# Patient Record
Sex: Female | Born: 1965
Health system: Southern US, Community
[De-identification: ages and names within clinical notes are randomized; demographics above are authoritative.]

## PROBLEM LIST (undated history)

## (undated) DIAGNOSIS — F32A Depression, unspecified: Secondary | ICD-10-CM

## (undated) DIAGNOSIS — F329 Major depressive disorder, single episode, unspecified: Secondary | ICD-10-CM

## (undated) DIAGNOSIS — K649 Unspecified hemorrhoids: Secondary | ICD-10-CM

## (undated) DIAGNOSIS — F419 Anxiety disorder, unspecified: Secondary | ICD-10-CM

## (undated) HISTORY — PX: BLADDER SURGERY: SHX569

## (undated) HISTORY — DX: Unspecified hemorrhoids: K64.9

## (undated) HISTORY — PX: SPINAL FUSION: SHX223

## (undated) HISTORY — DX: Anxiety disorder, unspecified: F41.9

---

## 1998-08-30 ENCOUNTER — Other Ambulatory Visit: Admission: RE | Admit: 1998-08-30 | Discharge: 1998-08-30 | Payer: Self-pay | Admitting: Obstetrics and Gynecology

## 1999-01-02 ENCOUNTER — Ambulatory Visit (HOSPITAL_COMMUNITY): Admission: RE | Admit: 1999-01-02 | Discharge: 1999-01-02 | Payer: Self-pay | Admitting: Family Medicine

## 1999-01-02 ENCOUNTER — Encounter: Payer: Self-pay | Admitting: Family Medicine

## 1999-04-16 ENCOUNTER — Emergency Department (HOSPITAL_COMMUNITY): Admission: EM | Admit: 1999-04-16 | Discharge: 1999-04-16 | Payer: Self-pay | Admitting: Emergency Medicine

## 1999-08-31 ENCOUNTER — Other Ambulatory Visit: Admission: RE | Admit: 1999-08-31 | Discharge: 1999-08-31 | Payer: Self-pay | Admitting: Obstetrics and Gynecology

## 2000-09-19 ENCOUNTER — Other Ambulatory Visit: Admission: RE | Admit: 2000-09-19 | Discharge: 2000-09-19 | Payer: Self-pay | Admitting: Obstetrics and Gynecology

## 2000-10-25 ENCOUNTER — Encounter: Payer: Self-pay | Admitting: Emergency Medicine

## 2000-10-25 ENCOUNTER — Emergency Department (HOSPITAL_COMMUNITY): Admission: EM | Admit: 2000-10-25 | Discharge: 2000-10-25 | Payer: Self-pay | Admitting: Emergency Medicine

## 2000-10-25 ENCOUNTER — Encounter: Payer: Self-pay | Admitting: Family Medicine

## 2000-11-01 ENCOUNTER — Encounter: Payer: Self-pay | Admitting: Neurosurgery

## 2000-11-01 ENCOUNTER — Observation Stay (HOSPITAL_COMMUNITY): Admission: RE | Admit: 2000-11-01 | Discharge: 2000-11-02 | Payer: Self-pay | Admitting: Neurosurgery

## 2000-12-02 ENCOUNTER — Encounter: Payer: Self-pay | Admitting: Neurosurgery

## 2000-12-02 ENCOUNTER — Encounter: Admission: RE | Admit: 2000-12-02 | Discharge: 2000-12-02 | Payer: Self-pay | Admitting: Neurosurgery

## 2001-01-14 ENCOUNTER — Encounter: Payer: Self-pay | Admitting: Neurosurgery

## 2001-01-14 ENCOUNTER — Encounter: Admission: RE | Admit: 2001-01-14 | Discharge: 2001-01-14 | Payer: Self-pay | Admitting: Neurosurgery

## 2001-03-25 ENCOUNTER — Encounter: Payer: Self-pay | Admitting: Neurosurgery

## 2001-03-25 ENCOUNTER — Encounter: Admission: RE | Admit: 2001-03-25 | Discharge: 2001-03-25 | Payer: Self-pay | Admitting: Neurosurgery

## 2001-10-15 ENCOUNTER — Other Ambulatory Visit: Admission: RE | Admit: 2001-10-15 | Discharge: 2001-10-15 | Payer: Self-pay | Admitting: Obstetrics and Gynecology

## 2002-03-23 ENCOUNTER — Emergency Department (HOSPITAL_COMMUNITY): Admission: EM | Admit: 2002-03-23 | Discharge: 2002-03-23 | Payer: Self-pay | Admitting: Emergency Medicine

## 2002-11-20 ENCOUNTER — Other Ambulatory Visit: Admission: RE | Admit: 2002-11-20 | Discharge: 2002-11-20 | Payer: Self-pay | Admitting: Obstetrics and Gynecology

## 2003-10-03 ENCOUNTER — Inpatient Hospital Stay (HOSPITAL_COMMUNITY): Admission: AD | Admit: 2003-10-03 | Discharge: 2003-10-05 | Payer: Self-pay | Admitting: Obstetrics and Gynecology

## 2003-10-29 ENCOUNTER — Other Ambulatory Visit: Admission: RE | Admit: 2003-10-29 | Discharge: 2003-10-29 | Payer: Self-pay | Admitting: Obstetrics and Gynecology

## 2005-01-02 ENCOUNTER — Other Ambulatory Visit: Admission: RE | Admit: 2005-01-02 | Discharge: 2005-01-02 | Payer: Self-pay | Admitting: Obstetrics and Gynecology

## 2005-02-09 ENCOUNTER — Ambulatory Visit: Payer: Self-pay | Admitting: Cardiology

## 2005-02-19 ENCOUNTER — Encounter: Admission: RE | Admit: 2005-02-19 | Discharge: 2005-05-20 | Payer: Self-pay | Admitting: Cardiology

## 2005-05-24 ENCOUNTER — Ambulatory Visit: Payer: Self-pay | Admitting: Gastroenterology

## 2006-01-25 ENCOUNTER — Other Ambulatory Visit: Admission: RE | Admit: 2006-01-25 | Discharge: 2006-01-25 | Payer: Self-pay | Admitting: Obstetrics and Gynecology

## 2008-09-03 ENCOUNTER — Ambulatory Visit: Payer: Self-pay | Admitting: Gastroenterology

## 2008-09-17 ENCOUNTER — Ambulatory Visit: Payer: Self-pay | Admitting: Gastroenterology

## 2009-09-26 ENCOUNTER — Encounter: Admission: RE | Admit: 2009-09-26 | Discharge: 2009-09-26 | Payer: Self-pay | Admitting: Obstetrics and Gynecology

## 2011-05-04 NOTE — Op Note (Signed)
[place]. [facility]  Patient:    Bethany Hernandez, Bethany Hernandez                      MRN: [id number] Proc. Date: 11/01/00 Adm. Date:  [id number] Disc. Date: [id number] Attending:  Coletta Memos                           Operative Report  PREOPERATIVE DIAGNOSIS:  C5-6 spinal instability secondary to ligamentous tear.  POSTOPERATIVE DIAGNOSIS:  C5-6 spinal instability secondary to ligamentous tear.  PROCEDURE:  Posterior spinal fusion C5-6 with lateral mass screws and rod and a spinous wiring with ______ cable and allograft.  COMPLICATIONS:  None.  SURGEON:  Kyle L. Franky Macho, M.D.  ASSISTANT:  Tanya Nones. Jeral Fruit, M.D.  INDICATIONS:  Bethany Hernandez is a 45 year old woman who presented to the emergency room with a C5-6 instability and unstable facets.  She had a ligamentous tear posteriorly.  She was therefore brought to the operating room for spinal fusion.  PROCEDURE IN DETAIL:  Bethany Hernandez was brought to the operating room, intubated and placed under general anesthesia without difficulty.  She was placed in a 3 pin fixation at approximately 70 pounds with a [place] system and then turned prone onto shoulder rolls with her Miami J collar in place to minimize any abnormal movements of the neck.  The collar was removed, and I took an x-ray and it showed her neck to be in good position in that she was reduced.  I then shaved the back of her head slightly and then prepped in sterile fashion.  She was draped in a sterile fashion.  I infiltrated her proposed incision with 0.50% lidocaine, 1:200,000 epinephrine using approximately 15 cc.  I made my skin incision with a #10 blade and took this down in the midline ______.  I then took this down to the initial spinous process that I was able to palpate with monopolar cautery.  Taking an x-ray showed that I was probably at C4.  I then extended my excision caudally and again exposed lamina until I had the spinous process of C7,  C5, and C4 out.  The spinous process of C6 was actually quite small and was somewhat tucked underneath that of C5, but through a number of intraoperative x-rays I was able to securely state that I was at C5-6.  I then exposed the lateral masses of C5-6 bilaterally.  I placed a ______ cable around the spinous processes of C5-6.  I then placed lateral mass ______  12 mm 3.5 pitch into C5 and C6 bilaterally, first on the right and then on the left side.  I took another x-ray and placed a rod at that point in time into the lateral mass grooves and secured that.  I took another x-ray and it showed the screws and the cable to be in good position and that she was in reduction.  I then irrigated the wound.  The facets were packed with synthetic fusion bone which had been mixed with the patients blood.  The facets were also denuded bilaterally.  The patient had her wound irrigated.  I then closed the wound in layer fashion, reapproximated the muscular tissue and then the fascia.  Then the subcutaneous tissues all with Vicryl sutures. The superficial skin was reapproximated with Steri-Strips.  Sterile dressing was applied.  The patient was awakened, moving all extremities well. DD:  11/01/00 TD:  11/02/00 Job:  [id number]

## 2011-05-04 NOTE — H&P (Signed)
[place]. [facility]  Patient:    Bethany Hernandez, Bethany Hernandez                      MRN: [id number] Adm. Date:  [id number] Attending:  Coletta Memos                         History and Physical  ADMITTING DIAGNOSIS: Neck pain and C5-6 instability.  HISTORY OF PRESENT ILLNESS: The patient is a 45 year old woman who fell off her daughters bed while roughhousing the morning of October 25, 2000 at 6 a.m.  She landed on her neck posteriorly and felt a pop at that time.  She has had significant pain between the shoulder blades the day of admission.  She also experienced some numbness and tingling in both shoulders while brushing her teeth that morning.  Due to the persistent pain she saw her local physician and she was then sent to the [facility] Room. Plain x-rays there showed that she had a kyphotic deformity at C5-6 due to a posterior ligamentous tear.  I then saw her at [facility] for consultation after obtaining a CT of the cervical spine.  She persisted with some neck pain and pain between the shoulder blades.  She had no bowel or bladder dysfunction.  She had no weakness.  She had no other problems whatsoever.  ALLERGIES: No known drug allergies.  PAST MEDICAL HISTORY: Her past medical history is excellent.  PAST SURGICAL HISTORY: She had bladder surgery as a child.  REVIEW OF SYSTEMS: The Review Of Systems is positive only for neck pain.  She denies constitutional, eye, ear, nose, throat, mouth, cardiovascular, respiratory, gastrointestinal, genitourinary, skin, neurologic, psychiatric, endocrine, hematologic, or allergic problems.  FAMILY HISTORY: Positive for cancer and heart disease.  Both parents are deceased.  MEDICATIONS: Vicodin on occasion for pain.  SOCIAL HISTORY: She does not use tobacco, alcohol, or illicit drugs.  PHYSICAL EXAMINATION:  GENERAL: She is alert and oriented x 4.  Speech is clear and  fluent.  VITAL SIGNS: Pulse 80.  HEENT: PERRL.  EOMI.  NEUROLOGIC: Cranial nerves 2-12 examination normal.  Memory, language, and attention span are normal.  Strength 5/5 in upper and lower extremities. Normal muscle tone, bulk, and coordination.  Reflexes 2+ at the biceps and triceps, brachial radialis, knees, and ankles.  NECK: No cervical masses or bruits.  CHEST: Lung fields clear.  HEART: Regular rate and rhythm.  EXTREMITIES: Pulses good at wrists and feet bilaterally.  Normal gait. Negative Romberg test.  ASSESSMENT/PLAN: I explained to the patient that we need to do a posterior cervical fusion secondary to ligamentous instability at C5-6.  She has been wearing a [place] J collar for the past week without difficulty and has had no neck pain or any return of the numbness and tingling she had initially the day of her injury.  The risks of the procedure including bleeding, infection, no pain relief, paralysis, upper extremity weakness, fusion failure, hardware failure were discussed.  She understands and wishes to proceed. DD:  11/01/00 TD:  11/02/00 Job: [id number]

## 2011-05-04 NOTE — Consult Note (Signed)
[place]. [facility]  Patient:    Bethany Hernandez, Bethany Hernandez                      MRN: [id number] Proc. Date: 10/25/00 Adm. Date:  [id number] Attending:  Osvaldo Human                          Consultation Report  CHIEF COMPLAINT:  Neck pain and questionable perched facet C5-6.  HISTORY OF PRESENT ILLNESS:  Ms. Brassfield is a 45 year old woman who fell off her daughters bed while roughhousing this morning approximately 6 a.m. She landed on her neck posteriorly and felt a pop at that time. She had significant pain between the shoulder blades. She also experienced some numbness and tingling in both shoulders when she was brushing her teeth this morning. Due to the persistent pain, she made an appointment to see a local physician; and she saw him, I think, approximately 3:30 this afternoon. She was then instructed to go to the [facility] Emergency Room where a cervical spine series revealed a malalignment at C5-6. She had splaying of the spinous processes at that level and appeared to have a ligamentous injury. She was then transferred to [facility] to receive CT and neurosurgical evaluation. Throughout the day, she has had a normal neurologic examination with the exception of the tingling in the shoulders which she had only this morning. She has had no bowel or bladder dysfunction. She has had no previous neck injuries. She has pain now at the junction of the neck and the shoulder blades posteriorly.  ALLERGIES:  No known drug allergies.  PAST MEDICAL HISTORY:  Excellent. She had bladder surgery as a child.  REVIEW OF SYSTEMS:  Positive only for neck pain. She denies constitutional eye, ear, nose, throat, mouth, cardiovascular, respiratory, gastrointestinal, genitourinary, skin, neurological, psychiatric, endocrine, hematologic, or allergic problems.  FAMILY HISTORY:  Positive for cancer and heart disease. Parents are both deceased.  CURRENT MEDICATIONS:   She takes no medications.  SOCIAL HISTORY:  She does use tobacco, alcohol, or illicit drugs.  PHYSICAL EXAMINATION:  GENERAL:  She alert and oriented x 4. Speech is clear and fluent.  VITAL SIGNS:  She is 5 feet 7 inches tall, weighing 160 pounds, blood pressure 115/75, pulse 80, respiratory rate 22, temperature 97.3.  HEENT:  Pupils are equal, round, and reactive to light. She has full extraocular movements.  NEUROLOGICAL:  Cranial nerves 2 through 12 are normal. Memory, language, and attention span are normal. She has 5/5 strength in the upper and lower extremities. Normal muscle tone, bulk, and coordination. She has intact proprioception to light touch. She has 2+ reflexes in the upper and lower extremities at the biceps, triceps, brachioradialis, knees, and ankles. Some pain with abduction of the right arm. She had no cervical masses or bruits. No discoloration to the cervical region. Some tenderness posteriorly approximately at C7 spinous process.  HEART:  Regular rate and rhythm. No murmurs or rubs.  EXTREMITIES:  Pulses good at the wrists and the feet bilaterally.  LUNGS:  Clear.  ABDOMEN:  Soft, nontender.  RADIOLOGIC STUDIES:  CT does not show evidence of bone fractures.  Plain films show malalignment at C5-6 with the C5 inferior facet riding high on the C6 facet although they are not jumped nor are they perched.  IMPRESSION:  C5-6 posterior spinous ligament rupture with subsequent malalignment at C5-6.  PLAN:  Secondary to the  fact that this is a ligamentous injury, it is unlikely that Mrs. Vetsch will be able to heal this with conservative treatment. I, therefore, plan on doing an elective posterior spinal fusion sometime next week. I would like to bring her back to my office so we can go over things more in detail. She is somewhat stunned tonight due to all of the various things that she has been hit with for what she thought was just going to be a minor  injury due to some roughhousing she and her daughter were doing this morning. She has no neurologic deficit; and therefore, I do not see any emergency to this. She is certainly stable in her collar, and I gave her instructions to keep the collar on at all times. I will see her back in the office and make arrangements for that. I will also give her a prescription for Tylenol No. 3 for pain control. DD:  10/25/00 TD:  10/26/00 Job: [id number]

## 2011-05-04 NOTE — Discharge Summary (Signed)
   NAME:  Bethany Hernandez, Bethany Hernandez                         ACCOUNT NO.:  [id number]   MEDICAL RECORD NO.:  [id number]                   PATIENT TYPE:  INP   LOCATION:  9120                                 FACILITY:  [facility]   PHYSICIAN:  Duke Salvia. Marcelle Overlie, M.D.            DATE OF BIRTH:  09-30-1966   DATE OF ADMISSION:  10/03/2003  DATE OF DISCHARGE:                                 DISCHARGE SUMMARY   HISTORY:  This patient was complete, complete, +2, straight OA.  Her Foley  catheter had just been removed.  She had a pretty solid epidural with poor  pushing quality.  When she did push when coached she had some variable  decelerations with somewhat slow recovery.  After talking with the patient  and her husband, decided to proceed with VE assisted delivery.  Facemask O2  for the mom was already running.  The Kiwi VE was applied.  Traction effort  coordinated with the next maternal pushing effort x1 only to affect delivery  of a healthy female, Apgars 9 and 9.  pH is pending.  This was done over a  midline episiotomy.  The infant was suctioned, placed on the mother's  abdomen in excellent condition.  Placenta delivered spontaneously intact.  The cervix and upper vagina were inspected and were unremarkable.  Surgeon's  finger was placed in the rectum to perform examination and this revealed  that the sphincter muscle and rectum were intact.  She stated to me at that  point that she had requested that her sphincter muscle be tightened, if  possible.  Several interrupted 2-0 Vicryl sutures were placed through the  sphincter muscle and fascia in an effort to try to reinforce this in the  midline.  Layer repair with 3-0 Vicryl Rapide closing the mucosa, 2-0 Vicryl  interrupted sutures on the deeper perineal tissue, and 3-0 Vicryl Rapide on  the perineal skin were used to close the episiotomy in standard fashion.  She tolerated this well.  Mother and baby doing well at that point.                           Richard M. Marcelle Overlie, M.D.    [facility]  D:  10/03/2003  T:  10/04/2003  Job:  [id number]

## 2012-07-02 ENCOUNTER — Other Ambulatory Visit (INDEPENDENT_AMBULATORY_CARE_PROVIDER_SITE_OTHER): Payer: Federal, State, Local not specified - PPO

## 2012-07-02 ENCOUNTER — Encounter: Payer: Self-pay | Admitting: Gastroenterology

## 2012-07-02 ENCOUNTER — Ambulatory Visit (INDEPENDENT_AMBULATORY_CARE_PROVIDER_SITE_OTHER): Payer: Federal, State, Local not specified - PPO | Admitting: Gastroenterology

## 2012-07-02 VITALS — BP 100/70 | HR 84 | Ht 67.0 in | Wt 236.0 lb

## 2012-07-02 DIAGNOSIS — R1011 Right upper quadrant pain: Secondary | ICD-10-CM

## 2012-07-02 DIAGNOSIS — Z800 Family history of malignant neoplasm of digestive organs: Secondary | ICD-10-CM

## 2012-07-02 LAB — CBC WITH DIFFERENTIAL/PLATELET
Basophils Absolute: 0.1 10*3/uL (ref 0.0–0.1)
HCT: 42.5 % (ref 36.0–46.0)
Lymphs Abs: 2.8 10*3/uL (ref 0.7–4.0)
Monocytes Relative: 7.2 % (ref 3.0–12.0)
Neutrophils Relative %: 55 % (ref 43.0–77.0)
Platelets: 209 10*3/uL (ref 150.0–400.0)
RDW: 13.3 % (ref 11.5–14.6)
WBC: 7.9 10*3/uL (ref 4.5–10.5)

## 2012-07-02 LAB — COMPREHENSIVE METABOLIC PANEL
ALT: 20 U/L (ref 0–35)
Albumin: 4 g/dL (ref 3.5–5.2)
CO2: 23 mEq/L (ref 19–32)
Calcium: 9.8 mg/dL (ref 8.4–10.5)
Chloride: 103 mEq/L (ref 96–112)
GFR: 54.32 mL/min — ABNORMAL LOW (ref >60.00)
Glucose, Bld: 91 mg/dL (ref 70–99)
Potassium: 4.2 mEq/L (ref 3.5–5.1)
Sodium: 138 mEq/L (ref 135–145)
Total Bilirubin: 0.2 mg/dL — ABNORMAL LOW (ref 0.3–1.2)
Total Protein: 7.6 g/dL (ref 6.0–8.3)

## 2012-07-02 LAB — LIPASE: Lipase: 26 U/L (ref 11.0–59.0)

## 2012-07-02 NOTE — Patient Instructions (Addendum)
Your physician has requested that you go to the basement for the following lab work before leaving today:CBC, Cmet, Lipase.  You have been scheduled for an abdominal ultrasound at [facility] ([address] on 07/07/12 at 8:00am. Please arrive 15 minutes prior to your appointment for registration. Make certain not to have anything to eat or drink 6 hours prior to your appointment. Should you need to reschedule your appointment, please contact [facility] at [phone].

## 2012-07-02 NOTE — Progress Notes (Signed)
History of Present Illness: This is a 46 year old female who relates 2 episodes of severe right upper quadrant pain/right lower chest pain radiating to her back that lasted for approximately 3 hours each. The first episode was associated with nausea and vomiting the second episode with nausea. She relates frequent belching and is concerned that this may be related to milk.  Denies weight loss, constipation, diarrhea, change in stool caliber, melena, hematochezia, nausea, vomiting, dysphagia, reflux symptoms, chest pain.  Review of Systems: Pertinent positive and negative review of systems were noted in the above HPI section. All other review of systems were otherwise negative.  Current Medications, Allergies, Past Medical History, Past Surgical History, Family History and Social History were reviewed in [employer] record.  Physical Exam: General: Well developed , well nourished, no acute distress Head: Normocephalic and atraumatic Eyes:  sclerae anicteric, EOMI Ears: Normal auditory acuity Mouth: No deformity or lesions Neck: Supple, no masses or thyromegaly Lungs: Clear throughout to auscultation Heart: Regular rate and rhythm; no murmurs, rubs or bruits Abdomen: Soft, non tender and non distended. No masses, hepatosplenomegaly or hernias noted. Normal Bowel sounds Musculoskeletal: Symmetrical with no gross deformities  Skin: No lesions on visible extremities Pulses:  Normal pulses noted Extremities: No clubbing, cyanosis, edema or deformities noted Neurological: Alert oriented x 4, grossly nonfocal Cervical Nodes:  No significant cervical adenopathy Inguinal Nodes: No significant inguinal adenopathy Psychological:  Alert and cooperative. Normal mood and affect  Assessment and Recommendations:  1. Right upper quadrant pain associated with nausea and vomiting. Rule out cholelithiasis. Schedule abdominal ultrasound and obtain blood work today. If no diagnosis is  uncovered begin antireflux measures, Prilosec OTC on a daily basis and schedule upper endoscopy.  2. Family history of rectal cancer in her mother at age 20. She is due for followup colonoscopy October 2014.

## 2012-07-07 ENCOUNTER — Other Ambulatory Visit: Payer: Federal, State, Local not specified - PPO

## 2012-07-14 ENCOUNTER — Ambulatory Visit
Admission: RE | Admit: 2012-07-14 | Discharge: 2012-07-14 | Disposition: A | Discharge status: Home / Self Care | Payer: Federal, State, Local not specified - PPO | Source: Ambulatory Visit | Attending: Gastroenterology | Admitting: Gastroenterology

## 2012-07-14 ENCOUNTER — Other Ambulatory Visit: Payer: Self-pay

## 2012-07-14 DIAGNOSIS — R1011 Right upper quadrant pain: Secondary | ICD-10-CM

## 2012-07-14 DIAGNOSIS — K8020 Calculus of gallbladder without cholecystitis without obstruction: Secondary | ICD-10-CM

## 2012-07-14 NOTE — Progress Notes (Signed)
I have left a message with [place] at [facility].  I am waiting on a surgical appt.  Patient is scheduled with Dr. Magnus Ivan for 07/24/12 2:30 arrival for 2;50 appt.  I have left a message for the patient to call back

## 2012-07-15 NOTE — Progress Notes (Signed)
Patient advised of the appt date and time.  She will call back for any additional questions or concerns

## 2012-07-24 ENCOUNTER — Ambulatory Visit (INDEPENDENT_AMBULATORY_CARE_PROVIDER_SITE_OTHER): Payer: Self-pay | Admitting: Surgery

## 2012-08-06 ENCOUNTER — Ambulatory Visit (INDEPENDENT_AMBULATORY_CARE_PROVIDER_SITE_OTHER): Payer: Self-pay | Admitting: Surgery

## 2012-08-25 ENCOUNTER — Encounter (INDEPENDENT_AMBULATORY_CARE_PROVIDER_SITE_OTHER): Payer: Self-pay | Admitting: Surgery

## 2012-08-25 ENCOUNTER — Ambulatory Visit (INDEPENDENT_AMBULATORY_CARE_PROVIDER_SITE_OTHER): Payer: Federal, State, Local not specified - PPO | Admitting: Surgery

## 2012-08-25 VITALS — BP 132/70 | HR 68 | Temp 97.3°F | Resp 14 | Ht 66.0 in | Wt 234.0 lb

## 2012-08-25 DIAGNOSIS — K8020 Calculus of gallbladder without cholecystitis without obstruction: Secondary | ICD-10-CM

## 2012-08-25 NOTE — Progress Notes (Signed)
Patient ID: Bethany Hernandez, female   DOB: [birth year], 46 y.o.   MRN: [id number]  Chief Complaint  Patient presents with  . Other    gallstones    HPI BRONTE Hernandez is a 46 y.o. female.   HPIthis is a very pleasant female referred by Dr. Russella Dar after an attack of symptomatic cholelithiasis. This occurred in July. She had right upper quadrant abdominal pain as well as nausea and vomiting after a fatty meal. The pain was severe but did not radiate anywhere else. Since then, she has been on a low-fat diet and has been completely pain free. She has no complaints today.  Past Medical History  Diagnosis Date  . Hemorrhoids   . Anxiety     Past Surgical History  Procedure Date  . Spinal fusion   . Bladder surgery     Family History  Problem Relation Age of Onset  . Rectal cancer Mother     Social History History  Substance Use Topics  . Smoking status: Never Smoker   . Smokeless tobacco: Never Used  . Alcohol Use: No    Allergies  Allergen Reactions  . Sulfonamide Derivatives Hives    All over the body    Current Outpatient Prescriptions  Medication Sig Dispense Refill  . buPROPion (WELLBUTRIN XL) 150 MG 24 hr tablet Take 150 mg by mouth daily.      . fluvoxaMINE (LUVOX) 100 MG tablet Take 100 mg by mouth at bedtime.        Review of Systems Review of Systems  Constitutional: Negative for fever, chills and unexpected weight change.  HENT: Negative for hearing loss, congestion, sore throat, trouble swallowing and voice change.   Eyes: Negative for visual disturbance.  Respiratory: Negative for cough and wheezing.   Cardiovascular: Negative for chest pain, palpitations and leg swelling.  Gastrointestinal: Negative for nausea, vomiting, abdominal pain, diarrhea, constipation, blood in stool, abdominal distention and anal bleeding.  Genitourinary: Negative for hematuria, vaginal bleeding and difficulty urinating.  Musculoskeletal: Negative for arthralgias.  Skin:  Negative for rash and wound.  Neurological: Negative for seizures, syncope and headaches.  Hematological: Negative for adenopathy. Does not bruise/bleed easily.  Psychiatric/Behavioral: Negative for confusion.    Blood pressure 132/70, pulse 68, temperature 97.3 F (36.3 C), temperature source Temporal, resp. rate 14, height 5\' 6"  (1.676 m), weight 234 lb (106.142 kg).  Physical Exam Physical Exam  Constitutional: She is oriented to person, place, and time. She appears well-developed and well-nourished. No distress.  HENT:  Head: Normocephalic and atraumatic.  Right Ear: External ear normal.  Left Ear: External ear normal.  Nose: Nose normal.  Mouth/Throat: Oropharynx is clear and moist. No oropharyngeal exudate.  Eyes: Conjunctivae are normal. Pupils are equal, round, and reactive to light. Right eye exhibits no discharge. Left eye exhibits no discharge. No scleral icterus.  Neck: Normal range of motion. Neck supple. No tracheal deviation present. No thyromegaly present.  Cardiovascular: Normal rate, regular rhythm, normal heart sounds and intact distal pulses.   No murmur heard. Pulmonary/Chest: Effort normal and breath sounds normal. No respiratory distress. She has no wheezes. She has no rales.  Abdominal: Soft. Bowel sounds are normal. She exhibits no distension. There is no tenderness. There is no rebound.  Musculoskeletal: Normal range of motion. She exhibits no edema and no tenderness.  Lymphadenopathy:    She has no cervical adenopathy.  Neurological: She is alert and oriented to person, place, and time.  Skin: Skin is warm and  dry. No rash noted. She is not diaphoretic. No erythema.  Psychiatric: Her behavior is normal. Judgment normal.    Data Reviewed I have her laboratory data showed normal liver function tests. Her white blood count is normal.  Ultrasound showed a 2.9 cm gallstone impacted in the gallbladder neck. Bile duct is normal  Assessment    Symptomatic  cholelithiasis    Plan   laparoscopic cholecystectomy with possible cholangiogram is recommended. I discussed the procedure in detail.  The patient was given Agricultural engineer.  We discussed the risks and benefits of a laparoscopic cholecystectomy and possible cholangiogram including, but not limited to bleeding, infection, injury to surrounding structures such as the intestine or liver, bile leak, retained gallstones, need to convert to an open procedure, prolonged diarrhea, blood clots such as  DVT, common bile duct injury, anesthesia risks, and possible need for additional procedures.  The likelihood of improvement in symptoms and return to the patient's normal status is good. We discussed the typical post-operative recovery course.likelihood of success is good.       Kamilya Wakeman A 08/25/2012, 9:23 AM

## 2012-09-10 ENCOUNTER — Telehealth (INDEPENDENT_AMBULATORY_CARE_PROVIDER_SITE_OTHER): Payer: Self-pay

## 2012-09-10 NOTE — Telephone Encounter (Signed)
You don't pee out gallstones.  She doesn't need another ultrasound

## 2012-09-10 NOTE — Telephone Encounter (Signed)
Patient scheduled for gallbladder surgery in November, patient states she passed a small gray pebble like object when voiding.  Patient states this happened end of June and wasn't sure if she needed to repeat her [place] before surgery to make sure it wasn't a gallstone she passed.

## 2012-10-17 ENCOUNTER — Other Ambulatory Visit (HOSPITAL_COMMUNITY): Payer: Federal, State, Local not specified - PPO

## 2012-10-24 ENCOUNTER — Ambulatory Visit: Admit: 2012-10-24 | Payer: Self-pay | Admitting: Surgery

## 2012-10-24 SURGERY — LAPAROSCOPIC CHOLECYSTECTOMY
Anesthesia: General

## 2012-11-11 ENCOUNTER — Encounter (INDEPENDENT_AMBULATORY_CARE_PROVIDER_SITE_OTHER): Payer: Federal, State, Local not specified - PPO | Admitting: Surgery

## 2013-03-15 IMAGING — US US ABDOMEN COMPLETE
1 series · 14 of 25 positions shown · non-contrast
Comparison: None.

CLINICAL DATA: Right upper abdominal pain

COMPLETE ABDOMINAL ULTRASOUND

[Series 1: us abdomen complete · 0.26mm/px · 14 of 81 slices shown]
[im 1/81]
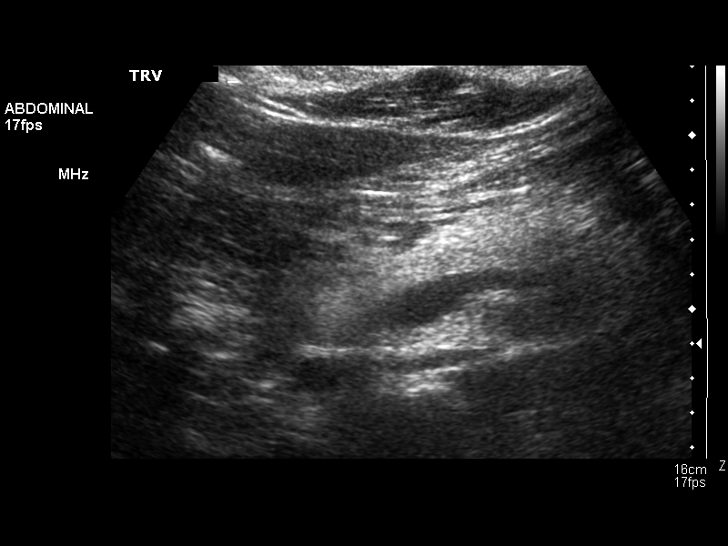
[im 7/81]
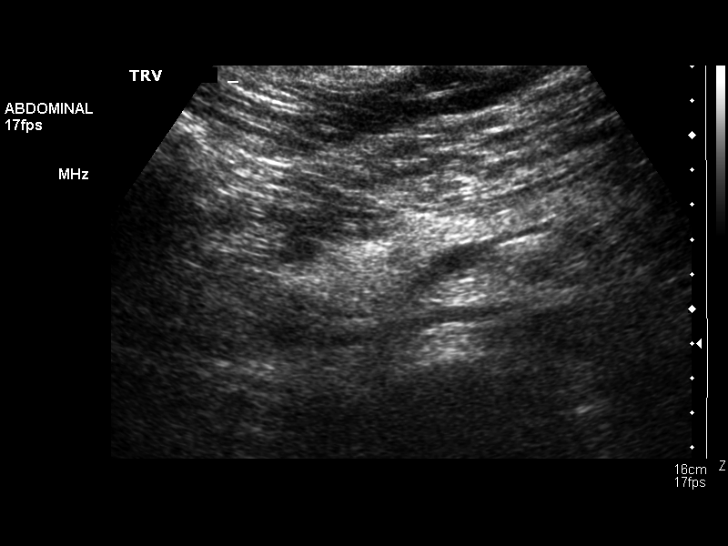
[im 14/81]
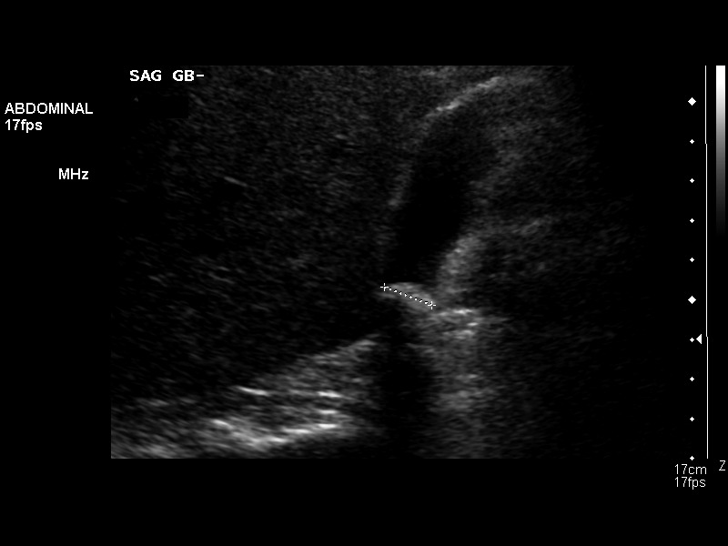
[im 21/81]
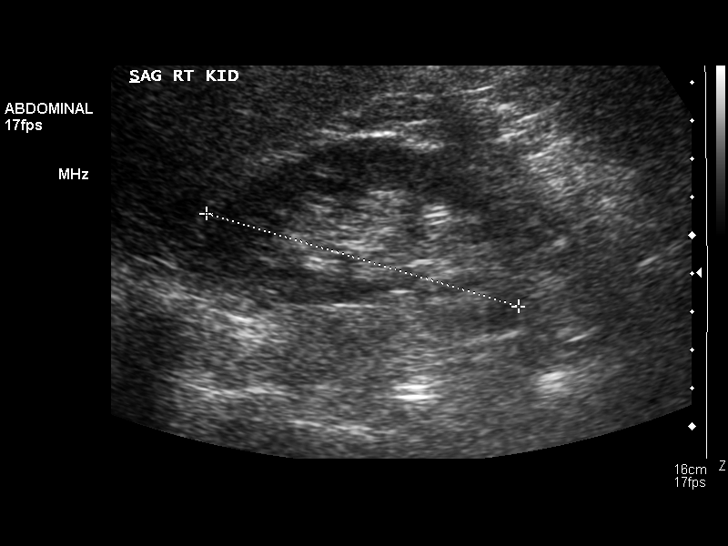
[im 27/81]
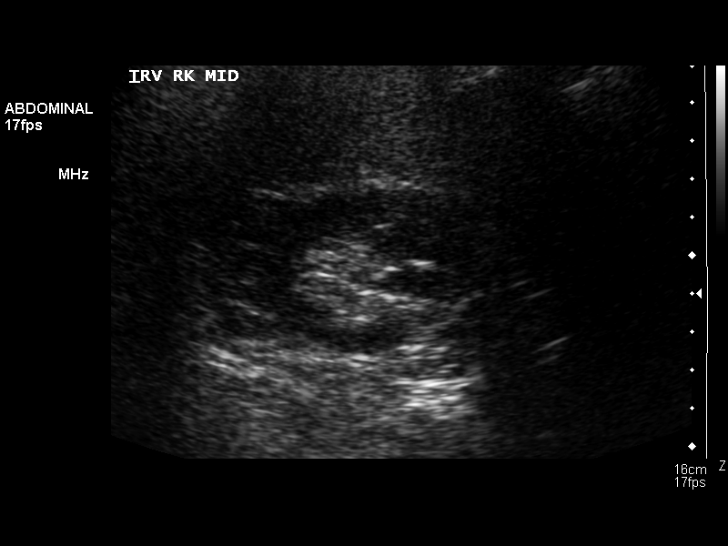
[im 31/81]
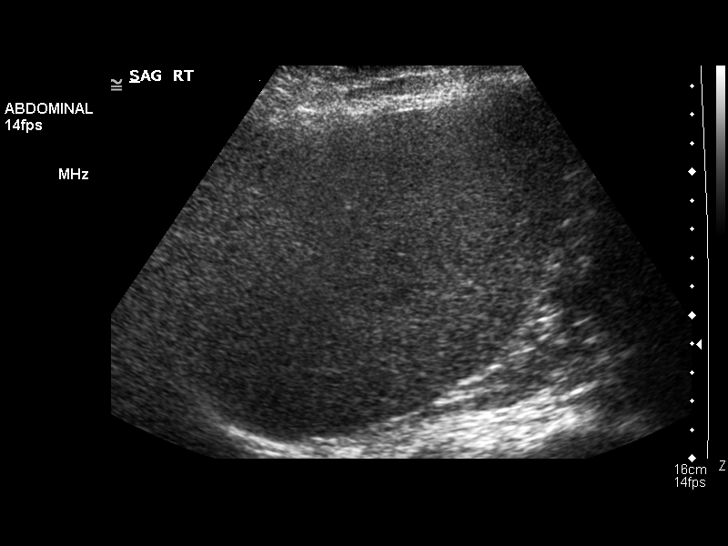
[im 37/81]
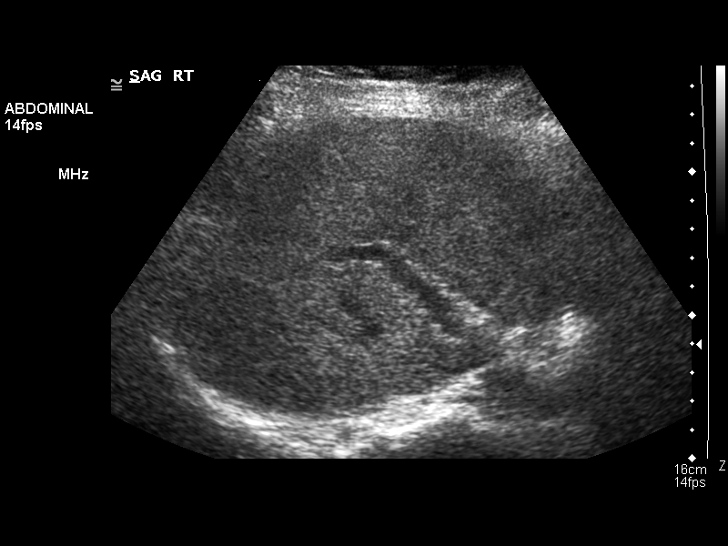
[im 44/81]
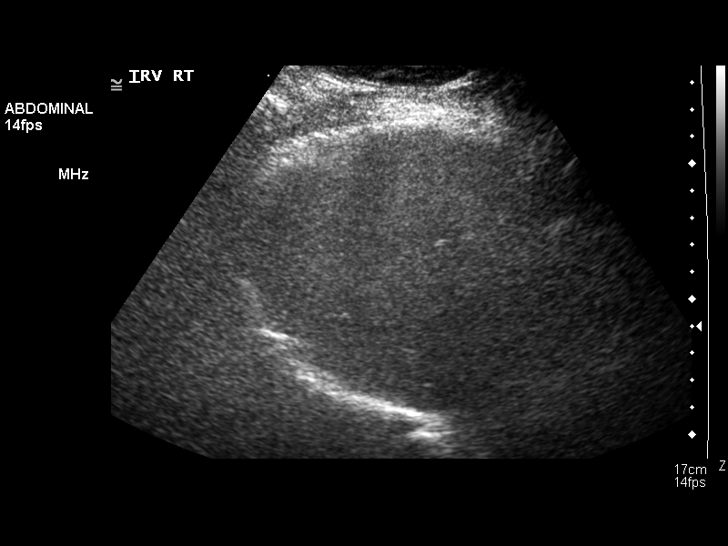
[im 51/81]
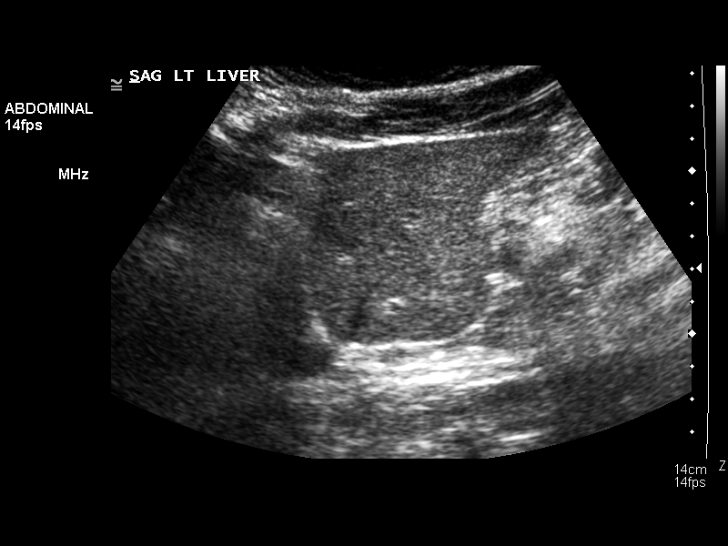
[im 54/81]
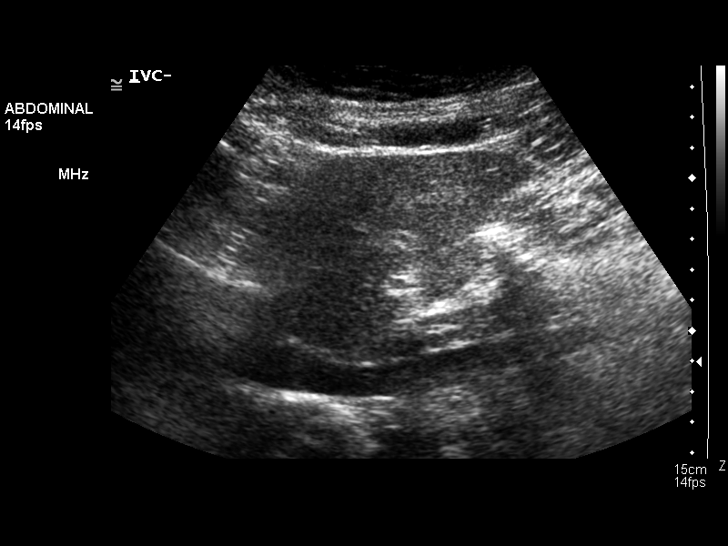
[im 61/81]
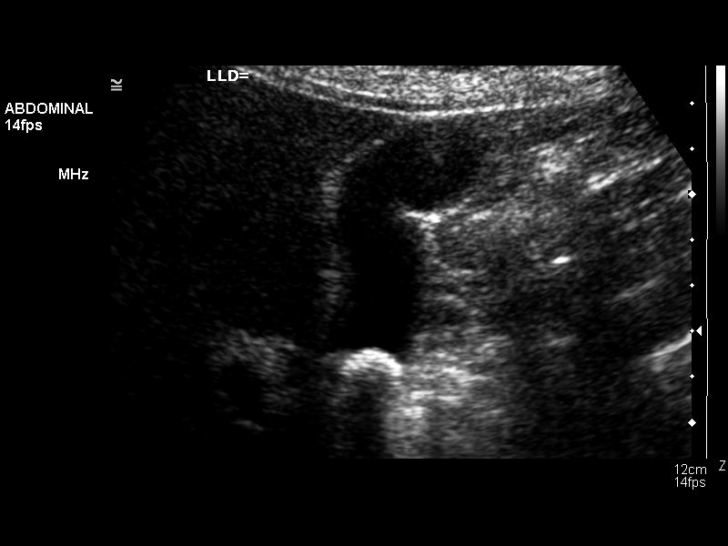
[im 67/81]
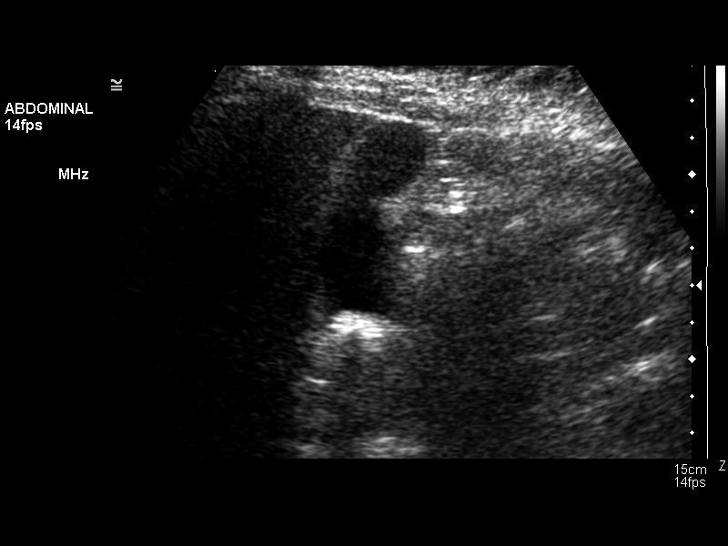
[im 74/81]
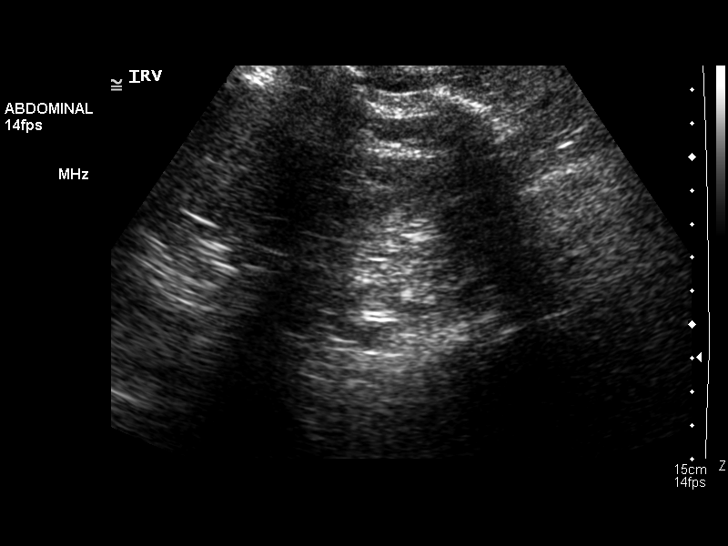
[im 81/81]
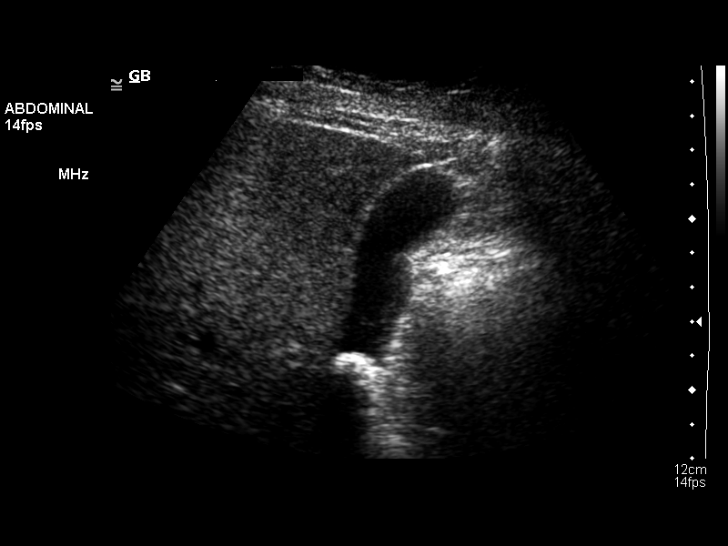

[14 of 25 positions shown; findings below may reference images not displayed]

FINDINGS: Gallbladder:  Physiologically distended.  There is a 13 mm stone
impacted the gallbladder neck.  There is no gallbladder wall
thickness or pericholecystic fluid.  Negative sonographic Murphy's
sign per technologist.

Common bile duct:  2.9 mm diameter, unremarkable.

Liver:  Normal size and echotexture without focal parenchymal
abnormality.  Patent portal vein with hepatopetal flow.

IVC:  Appears normal.

Pancreas:  No focal abnormality seen.

Spleen:  9.1 cm craniocaudal length, unremarkable.

Right Kidney:  10.5 cm. No hydronephrosis.  Well-preserved cortex.
Normal size and parenchymal echotexture without focal
abnormalities.

Left Kidney:  10.8 cm. No hydronephrosis.  Well-preserved cortex.
Normal size and parenchymal echotexture without focal
abnormalities.

Abdominal aorta:  No aneurysm identified.
IMPRESSION: 1.  Impacted stone in the gallbladder neck without other ultrasound
evidence of cholecystitis or biliary obstruction.

## 2013-07-30 ENCOUNTER — Encounter: Payer: Self-pay | Admitting: Gastroenterology

## 2013-10-21 ENCOUNTER — Other Ambulatory Visit: Payer: Self-pay | Admitting: Obstetrics and Gynecology

## 2013-10-21 DIAGNOSIS — R928 Other abnormal and inconclusive findings on diagnostic imaging of breast: Secondary | ICD-10-CM

## 2013-11-10 ENCOUNTER — Ambulatory Visit
Admission: RE | Admit: 2013-11-10 | Discharge: 2013-11-10 | Disposition: A | Discharge status: Home / Self Care | Payer: Federal, State, Local not specified - PPO | Source: Ambulatory Visit | Attending: Obstetrics and Gynecology | Admitting: Obstetrics and Gynecology

## 2013-11-10 DIAGNOSIS — R928 Other abnormal and inconclusive findings on diagnostic imaging of breast: Secondary | ICD-10-CM

## 2014-03-05 ENCOUNTER — Encounter: Payer: Self-pay | Admitting: Family Medicine

## 2014-03-05 ENCOUNTER — Encounter: Payer: Self-pay | Admitting: Gastroenterology

## 2014-03-05 ENCOUNTER — Ambulatory Visit (INDEPENDENT_AMBULATORY_CARE_PROVIDER_SITE_OTHER): Payer: Federal, State, Local not specified - PPO | Admitting: Family Medicine

## 2014-03-05 VITALS — BP 126/74 | HR 88 | Temp 98.6°F | Resp 16 | Ht 67.0 in | Wt 229.0 lb

## 2014-03-05 DIAGNOSIS — J019 Acute sinusitis, unspecified: Secondary | ICD-10-CM

## 2014-03-05 MED ORDER — AMOXICILLIN 500 MG PO CAPS: 500.0000 mg | ORAL_CAPSULE | Freq: Three times a day (TID) | ORAL | Status: DC

## 2014-03-05 MED ORDER — FLUTICASONE PROPIONATE 50 MCG/ACT NA SUSP: 2.0000 | Freq: Every day | NASAL | Status: DC

## 2014-03-05 NOTE — Progress Notes (Signed)
Patient ID: Bethany Hernandez, female   DOB: [birth year], 48 y.o.   MRN: [id number]   Subjective:    Patient ID: Bethany HeritageSandra W Hernandez, female    DOB: [birth year], 48 y.o.   MRN: [id number]  Patient presents for Nasal congestion  patient here with sinus pressure and drainage for the past week and a half. She has tried not used any over-the-counter medications she was concern for side effects. She's not had any fever. Initially her drainage is clear now has a thick yellow-green color with pain across her face. Positive sick contact with her daughter when her symptoms initially started. Her cough is very minimal only she has congestion and runs down the back of her throat.    Review Of Systems:  GEN- denies fatigue, fever, weight loss,weakness, recent illness HEENT- denies eye drainage, change in vision, +nasal discharge, CVS- denies chest pain, palpitations RESP- denies SOB,+ cough, wheeze Neuro- denies headache, dizziness, syncope, seizure activity       Objective:    BP 126/74  Pulse 88  Temp(Src) 98.6 F (37 C)  Resp 16  Ht 5\' 7"  (1.702 m)  Wt 229 lb (103.874 kg)  BMI 35.86 kg/m2  LMP 02/15/2014 GEN- NAD, alert and oriented x3 HEENT- PERRL, EOMI, non injected sclera, pink conjunctiva, MMM, oropharynx no injection, TM clear bilat no effusion,  + maxillary sinus tenderness, inflammed turbinates,  Nasal drainage  Neck- Supple, no LAD CVS- RRR, no murmur RESP-CTAB EXT- No edema Pulses- Radial 2+         Assessment & Plan:      Problem List Items Addressed This Visit   None    Visit Diagnoses   Acute sinusitis    -  Primary    Relevant Medications       fluticasone (FLONASE) 50 MCG nasal spray       amoxicillin (AMOXIL) capsule       Note: This dictation was prepared with Dragon dictation along with smaller phrase technology. Any transcriptional errors that result from this process are unintentional.

## 2014-03-05 NOTE — Patient Instructions (Signed)
Sinus infection, start antibiotics, flonase, mucinex D  F/U if not improved

## 2014-08-12 ENCOUNTER — Emergency Department (HOSPITAL_COMMUNITY)
Admission: EM | Admit: 2014-08-12 | Discharge: 2014-08-12 | Disposition: A | Discharge status: Home / Self Care | Payer: Federal, State, Local not specified - PPO | Attending: Emergency Medicine | Admitting: Emergency Medicine

## 2014-08-12 ENCOUNTER — Encounter (HOSPITAL_COMMUNITY): Payer: Self-pay | Admitting: Emergency Medicine

## 2014-08-12 DIAGNOSIS — Z8679 Personal history of other diseases of the circulatory system: Secondary | ICD-10-CM | POA: Insufficient documentation

## 2014-08-12 DIAGNOSIS — F411 Generalized anxiety disorder: Secondary | ICD-10-CM | POA: Diagnosis not present

## 2014-08-12 DIAGNOSIS — F3289 Other specified depressive episodes: Secondary | ICD-10-CM | POA: Diagnosis not present

## 2014-08-12 DIAGNOSIS — Z3202 Encounter for pregnancy test, result negative: Secondary | ICD-10-CM | POA: Diagnosis not present

## 2014-08-12 DIAGNOSIS — F329 Major depressive disorder, single episode, unspecified: Secondary | ICD-10-CM | POA: Diagnosis not present

## 2014-08-12 DIAGNOSIS — R42 Dizziness and giddiness: Secondary | ICD-10-CM

## 2014-08-12 DIAGNOSIS — Z79899 Other long term (current) drug therapy: Secondary | ICD-10-CM | POA: Diagnosis not present

## 2014-08-12 DIAGNOSIS — R112 Nausea with vomiting, unspecified: Secondary | ICD-10-CM | POA: Diagnosis not present

## 2014-08-12 HISTORY — DX: Depression, unspecified: F32.A

## 2014-08-12 HISTORY — DX: Major depressive disorder, single episode, unspecified: F32.9

## 2014-08-12 LAB — CBC WITH DIFFERENTIAL/PLATELET
Basophils Absolute: 0 10*3/uL (ref 0.0–0.1)
Basophils Relative: 0 % (ref 0–1)
EOS PCT: 1 % (ref 0–5)
Eosinophils Absolute: 0.1 10*3/uL (ref 0.0–0.7)
HEMATOCRIT: 43.7 % (ref 36.0–46.0)
Hemoglobin: 14.3 g/dL (ref 12.0–15.0)
LYMPHS ABS: 1.8 10*3/uL (ref 0.7–4.0)
Lymphocytes Relative: 23 % (ref 12–46)
MCH: 29.2 pg (ref 26.0–34.0)
MCHC: 32.7 g/dL (ref 30.0–36.0)
MCV: 89.2 fL (ref 78.0–100.0)
Monocytes Absolute: 0.4 10*3/uL (ref 0.1–1.0)
Monocytes Relative: 5 % (ref 3–12)
NEUTROS ABS: 5.4 10*3/uL (ref 1.7–7.7)
Neutrophils Relative %: 71 % (ref 43–77)
Platelets: 211 10*3/uL (ref 150–400)
RBC: 4.9 MIL/uL (ref 3.87–5.11)
RDW: 13.5 % (ref 11.5–15.5)
WBC: 7.7 10*3/uL (ref 4.0–10.5)

## 2014-08-12 LAB — COMPREHENSIVE METABOLIC PANEL
ALT: 19 U/L (ref 0–35)
AST: 27 U/L (ref 0–37)
Albumin: 3.5 g/dL (ref 3.5–5.2)
Alkaline Phosphatase: 32 U/L — ABNORMAL LOW (ref 39–117)
Anion gap: 14 (ref 5–15)
BUN: 16 mg/dL (ref 6–23)
CO2: 23 meq/L (ref 19–32)
CREATININE: 0.98 mg/dL (ref 0.50–1.10)
Calcium: 8.6 mg/dL (ref 8.4–10.5)
Chloride: 104 mEq/L (ref 96–112)
GFR, EST AFRICAN AMERICAN: 79 mL/min — AB (ref >90)
GFR, EST NON AFRICAN AMERICAN: 68 mL/min — AB (ref >90)
Glucose, Bld: 92 mg/dL (ref 70–99)
Potassium: 5.1 mEq/L (ref 3.7–5.3)
Sodium: 141 mEq/L (ref 137–147)
Total Protein: 7 g/dL (ref 6.0–8.3)

## 2014-08-12 LAB — URINALYSIS, ROUTINE W REFLEX MICROSCOPIC
Bilirubin Urine: NEGATIVE
Glucose, UA: NEGATIVE mg/dL
Hgb urine dipstick: NEGATIVE
Ketones, ur: NEGATIVE mg/dL
LEUKOCYTES UA: NEGATIVE
NITRITE: NEGATIVE
PROTEIN: NEGATIVE mg/dL
Specific Gravity, Urine: 1.018 (ref 1.005–1.030)
Urobilinogen, UA: 0.2 mg/dL (ref 0.0–1.0)
pH: 7 (ref 5.0–8.0)

## 2014-08-12 LAB — POC URINE PREG, ED: PREG TEST UR: NEGATIVE

## 2014-08-12 LAB — LIPASE, BLOOD: LIPASE: 27 U/L (ref 11–59)

## 2014-08-12 LAB — I-STAT TROPONIN, ED: TROPONIN I, POC: 0 ng/mL (ref 0.00–0.08)

## 2014-08-12 MED ORDER — SODIUM CHLORIDE 0.9 % IV BOLUS (SEPSIS)
1000.0000 mL | Freq: Once | INTRAVENOUS | Status: AC
  Administered 2014-08-12: 1000 mL via INTRAVENOUS

## 2014-08-12 MED ORDER — METOCLOPRAMIDE HCL 5 MG/ML IJ SOLN
10.0000 mg | Freq: Once | INTRAMUSCULAR | Status: AC
  Administered 2014-08-12: 10 mg via INTRAVENOUS
  Filled 2014-08-12: qty 2

## 2014-08-12 MED ORDER — DIPHENHYDRAMINE HCL 50 MG/ML IJ SOLN
25.0000 mg | Freq: Once | INTRAMUSCULAR | Status: AC
  Administered 2014-08-12: 25 mg via INTRAVENOUS
  Filled 2014-08-12: qty 1

## 2014-08-12 MED ORDER — MECLIZINE HCL 25 MG PO TABS
25.0000 mg | ORAL_TABLET | Freq: Once | ORAL | Status: AC
  Administered 2014-08-12: 25 mg via ORAL
  Filled 2014-08-12: qty 1

## 2014-08-12 MED ORDER — MECLIZINE HCL 32 MG PO TABS: 32.0000 mg | ORAL_TABLET | Freq: Three times a day (TID) | ORAL | Status: DC | PRN

## 2014-08-12 MED ORDER — ONDANSETRON HCL 4 MG/2ML IJ SOLN
4.0000 mg | Freq: Once | INTRAMUSCULAR | Status: AC
  Administered 2014-08-12: 4 mg via INTRAVENOUS
  Filled 2014-08-12: qty 2

## 2014-08-12 MED ORDER — DIAZEPAM 5 MG/ML IJ SOLN
5.0000 mg | Freq: Once | INTRAMUSCULAR | Status: AC
  Administered 2014-08-12: 5 mg via INTRAVENOUS
  Filled 2014-08-12: qty 2

## 2014-08-12 NOTE — Discharge Instructions (Signed)
We have diagnosed you with vertigo that is likely caused by the buildup of ear wax in your left ear. Recommend that you do not try to clean out your ear with a Q-tip. Instead, he can irrigate her ear with water or hydrogen peroxide. He may also try debrox which are ear drops that can be found at any pharmacy. We have written you a prescription for meclizine which she can take should your symptoms come back. Please seek medical attention should your symptoms persist or worsen.

## 2014-08-12 NOTE — ED Provider Notes (Signed)
CSN: [id number]     Arrival date & time 08/12/14  0703 History   First MD Initiated Contact with Patient 08/12/14 [phone]     Chief Complaint  Patient presents with  . Emesis  . Dizziness   Patient is a 48 y.o. female presenting with vomiting and dizziness.  Emesis Dizziness Associated symptoms: nausea and vomiting     Bethany Hernandez is a 48 year old female with a history of depression who presents with episodes of dizziness and vomiting this morning.  Patient reports that around 5 AM this morning, patient was on the toilet when she started feeling the room spinning around her. Within the next several hours, this episode repeated 3 times, with the last time causing enough nausea where she vomited. She said she only vomited gastric juices and there was no blood.She says that moving her head around and makes the vertigo worse, and these episodes are not necessarily brought on by exertion or standing up. She denies any tinnitus or hearing loss. Patient states that she has had episodes of dizziness several years ago and she has been seen by a physician for. She does not know what tests were performed for this workup. Patient denies any episodes of unilateral weakness, facial droop, speech slurring, chest pain, shortness of breath, abdominal pain, or syncope.  Past Medical History  Diagnosis Date  . Hemorrhoids   . Anxiety   . Depression    Past Surgical History  Procedure Laterality Date  . Spinal fusion    . Bladder surgery     Family History  Problem Relation Age of Onset  . Rectal cancer Mother    History  Substance Use Topics  . Smoking status: Never Smoker   . Smokeless tobacco: Never Used  . Alcohol Use: No   OB History   Grav Para Term Preterm Abortions TAB SAB Ect Mult Living                 Review of Systems  Gastrointestinal: Positive for nausea and vomiting.  Neurological: Positive for dizziness.  All other systems reviewed and are negative.   Allergies  Sulfonamide  derivatives  Home Medications   Prior to Admission medications   Medication Sig Start Date End Date Taking? Authorizing Provider  buPROPion (WELLBUTRIN XL) 150 MG 24 hr tablet Take 150 mg by mouth 2 (two) times daily.    Yes Historical Provider, MD  fluvoxaMINE (LUVOX) 100 MG tablet Take 100 mg by mouth at bedtime.   Yes Historical Provider, MD   BP 125/75  Pulse 85  Temp(Src) 98.2 F (36.8 C) (Oral)  Resp 12  Ht  (1.702 m)  Wt 230 lb (104.327 kg)  BMI 36.01 kg/m2  SpO2 100%  LMP 05/12/2014 Physical Exam  General: resting in bed HEENT: PERRL, EOMI, no scleral icterus, cerumen impaction on the left Cardiac: RRR, no rubs, murmurs or gallops Pulm: clear to auscultation bilaterally, moving normal volumes of air Abd: soft, nontender, nondistended, BS present Ext: warm and well perfused, no pedal edema Neuro: alert and oriented X3 Cranial nerves II through XII intact. Horizontal right beating nystagmus upon rightward fixation. Head impulse test positive when impulsing the head to the left. Negative skew deviation. 5+ strength throughout upper and lower extremities. Sensation intact to light touch throughout upper and lower extremities. Normal deep tendon reflexes throughout. Coordination intact with finger to nose. Gait within normal limits.  ED Course  Procedures (including critical care time) Labs Review Labs Reviewed  URINALYSIS, ROUTINE W  REFLEX MICROSCOPIC  CBC WITH DIFFERENTIAL  COMPREHENSIVE METABOLIC PANEL  LIPASE, BLOOD  I-STAT TROPOININ, ED  POC URINE PREG, ED    Imaging Review No results found.   EKG Interpretation   Date/Time:  Thursday August 12 2014 07:13:02 EDT Ventricular Rate:  83 PR Interval:  137 QRS Duration: 114 QT Interval:  404 QTC Calculation: 475 R Axis:   59 Text Interpretation:  Sinus rhythm Incomplete right bundle branch block No  previous ECGs available Confirmed by YAO  MD, DAVID ([id number]) on 08/12/2014  7:22:06 AM      MDM    Final diagnoses:  Vertigo    Patient is a 48 year old with a history of depression who presents with vertigo. This is favored to be peripheral vestibular nerve rather than central dysfunction. Patient does not have any risk factors CVA or cardiac risk factors. HINTS exam reassuring for peripheral nerve dysfunction as opposed to central. No signs of any focal neurological deficits. Exam remarkable for left cerumen impaction.  - Meclizine and ondansetron for nausea  - Irrigation of left ear for cerumen impaction  9:30 AM  - CBC, CMP, lipase within normal limits.  - Patient persistently vertigo. Left auditory canal now patent. Will further. Will further palliate with Reglan, Valium, and Benadryl. Will plan on MRI if patient persistently symptomatic.  10:30 AM  - Patient reporting much improved dizziness and nausea.  11:36 AM  - Patient reports continued improvement of dizziness and nausea. Patient exam with finger to nose intact. Patient ambulating deficit. Discharge patient with meclizine.  Harold Barban, MD 08/12/14 1136

## 2014-08-12 NOTE — ED Provider Notes (Signed)
I saw and evaluated the patient, reviewed the resident's note and I agree with the findings and plan.   EKG Interpretation   Date/Time:  Thursday August 12 2014 07:13:02 EDT Ventricular Rate:  83 PR Interval:  137 QRS Duration: 114 QT Interval:  404 QTC Calculation: 475 R Axis:   59 Text Interpretation:  Sinus rhythm Incomplete right bundle branch block No  previous ECGs available Confirmed by YAO  MD, DAVID ([id number]) on 08/12/2014  7:22:06 AM      Bethany Hernandez is a 48 y.o. female here with vertigo. Acute onset of vertigo this morning. Felt room was spinning. Had some vomiting as well. Vitals stable. Has R nystamus, not changing with direction. Finger to nose nl, nl gait. There is obvious L cerumen impaction, which was cleaned by nurse. I think its likely cause of vertigo. HINTS exam neg. I doubt central vertigo. After meds, felt better. Stable for d/c.    Richardean Canal, MD 08/12/14 [phone]

## 2014-08-12 NOTE — ED Notes (Signed)
Pt arrives via EMs from home with nausea, vomiting, dizziness that began this AM. Pt reports diaphoresis and palpitations at home. 12 lead EKG unremarkable. VSS in transport. Pt awake, stroke scale neg, NAd at present.

## 2014-08-19 ENCOUNTER — Ambulatory Visit (INDEPENDENT_AMBULATORY_CARE_PROVIDER_SITE_OTHER): Payer: Federal, State, Local not specified - PPO | Admitting: Family Medicine

## 2014-08-19 ENCOUNTER — Encounter: Payer: Self-pay | Admitting: Family Medicine

## 2014-08-19 VITALS — BP 128/74 | HR 80 | Temp 98.3°F | Resp 16 | Ht 67.0 in | Wt 226.0 lb

## 2014-08-19 DIAGNOSIS — H8110 Benign paroxysmal vertigo, unspecified ear: Secondary | ICD-10-CM

## 2014-08-19 DIAGNOSIS — I809 Phlebitis and thrombophlebitis of unspecified site: Secondary | ICD-10-CM

## 2014-08-19 NOTE — Progress Notes (Signed)
   Subjective:    Patient ID: Bethany Hernandez, female    DOB: 09-15-1966, 48 y.o.   MRN: [id number]  HPI  Patient went to the emergency room Saturday with the sudden onset of vertigo when she sat up in bed.  Since that time she had episodes of vertigo during associated with position changes. She denies any hearing loss. She denies any ear pain. She denies any tinnitus. She had blood drawn at the emergency room in her right forearm. Near the site of the blood draw there is a palpable firmness in the superficial vein. There is also tender. There is no erythema or warmth. There is no swelling in the arm. Past Medical History  Diagnosis Date  . Hemorrhoids   . Anxiety   . Depression    Current Outpatient Prescriptions on File Prior to Visit  Medication Sig Dispense Refill  . buPROPion (WELLBUTRIN XL) 150 MG 24 hr tablet Take 150 mg by mouth 2 (two) times daily.       . fluvoxaMINE (LUVOX) 100 MG tablet Take 100 mg by mouth at bedtime.      . meclizine (ANTIVERT) 32 MG tablet Take 1 tablet (32 mg total) by mouth 3 (three) times daily as needed.  30 tablet  0   No current facility-administered medications on file prior to visit.   Allergies  Allergen Reactions  . Sulfonamide Derivatives Hives and Other (See Comments)    All over the body   History   Social History  . Marital Status: Married    Spouse Name: N/A    Number of Children: 2  . Years of Education: N/A   Occupational History  . Legal Tech    Social History Main Topics  . Smoking status: Never Smoker   . Smokeless tobacco: Never Used  . Alcohol Use: No  . Drug Use: No  . Sexual Activity: Yes   Other Topics Concern  . Not on file   Social History Narrative  . No narrative on file     Review of Systems  All other systems reviewed and are negative.      Objective:   Physical Exam  Vitals reviewed. Cardiovascular: Normal rate, regular rhythm and normal heart sounds.   No murmur heard. Pulmonary/Chest:  Effort normal and breath sounds normal. No respiratory distress. She has no wheezes. She has no rales.  Abdominal: Soft. Bowel sounds are normal. She exhibits no distension. There is no tenderness. There is no rebound and no guarding.  Musculoskeletal: She exhibits no edema.   thrombophlebitis in the right forearm Patient has a positive Dix-Hallpike maneuver        Assessment & Plan:  Superficial thrombophlebitis  BPPV (benign paroxysmal positional vertigo), unspecified laterality  Patient has superficial thrombophlebitis in the right forearm due to her blood draw. I recommend an aspirin 325 mg by mouth daily for the next 2-3 weeks. Also recommended warm compresses. Anticipate gradual resolution of the thrombophlebitis. I recommended an ultrasound of the right upper extremity she's developed unilateral swelling. Patient also has BPPV. I anticipate gradual spontaneous resolution over the next one to 2 weeks. She can use meclizine as needed for dizziness.

## 2014-10-08 ENCOUNTER — Encounter: Payer: Self-pay | Admitting: Family Medicine

## 2014-10-08 ENCOUNTER — Ambulatory Visit (INDEPENDENT_AMBULATORY_CARE_PROVIDER_SITE_OTHER): Payer: Federal, State, Local not specified - PPO | Admitting: Family Medicine

## 2014-10-08 VITALS — BP 128/74 | HR 84 | Temp 98.8°F | Resp 16 | Ht 67.0 in | Wt 226.0 lb

## 2014-10-08 DIAGNOSIS — K1120 Sialoadenitis, unspecified: Secondary | ICD-10-CM

## 2014-10-08 MED ORDER — AMOXICILLIN-POT CLAVULANATE 875-125 MG PO TABS: 1.0000 | ORAL_TABLET | Freq: Two times a day (BID) | ORAL | Status: DC

## 2014-10-08 NOTE — Progress Notes (Signed)
   Subjective:    Patient ID: Bethany Hernandez, female    DOB: 1966-04-11, 48 y.o.   MRN: [id number]  HPI Patient has an area behind the angle of the mandible on her right side which is exquisitely tender to palpation. There is a 170 her lymph node versus swollen parotid gland in this area. There is no redness. There is no warmth. There is no abnormalities witnessed on examination of the posterior oropharynx. Both external auditory canals are clear. Both tympanic membranes appear normal. There is no other lymphadenopathy in the neck. Past Medical History  Diagnosis Date  . Hemorrhoids   . Anxiety   . Depression    Past Surgical History  Procedure Laterality Date  . Spinal fusion    . Bladder surgery     Current Outpatient Prescriptions on File Prior to Visit  Medication Sig Dispense Refill  . buPROPion (WELLBUTRIN XL) 150 MG 24 hr tablet Take 150 mg by mouth 2 (two) times daily.       . fluvoxaMINE (LUVOX) 100 MG tablet Take 100 mg by mouth at bedtime.       No current facility-administered medications on file prior to visit.   Allergies  Allergen Reactions  . Sulfonamide Derivatives Hives and Other (See Comments)    All over the body   History   Social History  . Marital Status: Married    Spouse Name: N/A    Number of Children: 2  . Years of Education: N/A   Occupational History  . Legal Tech    Social History Main Topics  . Smoking status: Never Smoker   . Smokeless tobacco: Never Used  . Alcohol Use: No  . Drug Use: No  . Sexual Activity: Yes   Other Topics Concern  . Not on file   Social History Narrative  . No narrative on file     Review of Systems  All other systems reviewed and are negative.      Objective:   Physical Exam  Vitals reviewed. Constitutional: She appears well-developed and well-nourished.  HENT:  Right Ear: External ear normal.  Left Ear: External ear normal.  Nose: Nose normal.  Mouth/Throat: Oropharynx is clear and moist. No  oropharyngeal exudate.  Eyes: Pupils are equal, round, and reactive to light.  Neck: Neck supple.  Cardiovascular: Normal rate, regular rhythm and normal heart sounds.   Pulmonary/Chest: Effort normal and breath sounds normal.  Lymphadenopathy:    She has no cervical adenopathy.          Assessment & Plan:  Sialadenitis - Plan: amoxicillin-clavulanate (AUGMENTIN) 875-125 MG per tablet  This represents an inflamed inferior portion of the parotid gland versus a tender lymph node. At the present time I have recommended ibuprofen 800 mg every 8 hours and warm compresses 3 times a day throughout the weekend. If the patient develops fevers or chills oral area swells markedly I want her to begin Augmentin 1 tablet by mouth twice a day 10 days. Otherwise I anticipate spontaneous resolution over the weekend. If symptoms are no better next week, I would like to recheck the patient.

## 2016-01-03 ENCOUNTER — Ambulatory Visit: Payer: Federal, State, Local not specified - PPO | Admitting: Family Medicine

## 2016-03-14 ENCOUNTER — Encounter: Payer: Self-pay | Admitting: Gastroenterology

## 2016-03-16 ENCOUNTER — Other Ambulatory Visit: Payer: Self-pay | Admitting: Obstetrics and Gynecology

## 2016-03-16 DIAGNOSIS — R140 Abdominal distension (gaseous): Secondary | ICD-10-CM

## 2016-03-16 DIAGNOSIS — R1030 Lower abdominal pain, unspecified: Secondary | ICD-10-CM

## 2016-03-23 ENCOUNTER — Ambulatory Visit
Admission: RE | Admit: 2016-03-23 | Discharge: 2016-03-23 | Disposition: A | Discharge status: Home / Self Care | Payer: Federal, State, Local not specified - PPO | Source: Ambulatory Visit | Attending: Obstetrics and Gynecology | Admitting: Obstetrics and Gynecology

## 2016-03-23 DIAGNOSIS — R1030 Lower abdominal pain, unspecified: Secondary | ICD-10-CM

## 2016-03-23 DIAGNOSIS — R140 Abdominal distension (gaseous): Secondary | ICD-10-CM

## 2016-03-23 MED ORDER — IOPAMIDOL (ISOVUE-300) INJECTION 61%
125.0000 mL | Freq: Once | INTRAVENOUS | Status: AC | PRN
  Administered 2016-03-23: 125 mL via INTRAVENOUS

## 2016-04-25 ENCOUNTER — Encounter: Payer: Self-pay | Admitting: Gastroenterology

## 2016-04-25 ENCOUNTER — Ambulatory Visit (INDEPENDENT_AMBULATORY_CARE_PROVIDER_SITE_OTHER): Payer: Federal, State, Local not specified - PPO | Admitting: Gastroenterology

## 2016-04-25 VITALS — BP 110/80 | HR 78 | Ht 67.0 in | Wt 234.0 lb

## 2016-04-25 DIAGNOSIS — K8070 Calculus of gallbladder and bile duct without cholecystitis without obstruction: Secondary | ICD-10-CM | POA: Diagnosis not present

## 2016-04-25 DIAGNOSIS — Z800 Family history of malignant neoplasm of digestive organs: Secondary | ICD-10-CM | POA: Diagnosis not present

## 2016-04-25 DIAGNOSIS — R1030 Lower abdominal pain, unspecified: Secondary | ICD-10-CM | POA: Diagnosis not present

## 2016-04-25 MED ORDER — NA SULFATE-K SULFATE-MG SULF 17.5-3.13-1.6 GM/177ML PO SOLN: 1.0000 | Freq: Once | ORAL | Status: DC

## 2016-04-25 NOTE — Progress Notes (Signed)
    History of Present Illness: This is a 50 year old female referred by Marcelle OverlieGrewal, Michelle, MD for the evaluation of lower abdominal/pelvic pain, bloating, lower back pain, family history of rectal cancer and cholelithiasis. I previously evaluated her in 2013 for right upper quadrant pain. Symptomatic cholelithiasis was diagnosed and she was referred to Dr. Carman Chingoug Blackman who recommended cholecystectomy however she did not proceed as her symptoms resolved. She was also recommended to undergo screening colonoscopy in October 2014 however she did not proceed. Since December she has noted lower abdominal pain, pelvic pain, low back pain and occasional bloating. She was evaluated by Dr. Vincente PoliGrewal underwent CT scan of the abdomen/pelvis with results below. Her GYN evaluation was unremarkable Over the past few weeks all the symptoms have resolved. She is currently asymptomatic. Denies weight loss, abdominal pain, constipation, diarrhea, change in stool caliber, melena, hematochezia, nausea, vomiting, dysphagia, reflux symptoms, chest pain.   Abdominal/pelvic CT for 07/05/2016 IMPRESSION: No evidence of bowel obstruction. Normal appendix. Cholelithiasis, without associated sonographic findings to suggest acute cholecystitis. Degenerative changes of the visualized thoracolumbar spine. Grade 1 spondylolisthesis at L5-S1.   Review of Systems: Pertinent positive and negative review of systems were noted in the above HPI section. All other review of systems were otherwise negative.  Current Medications, Allergies, Past Medical History, Past Surgical History, Family History and Social History were reviewed in [facility] electronic medical record.  Physical Exam: General: Well developed, well nourished, no acute distress Head: Normocephalic and atraumatic Eyes:  sclerae anicteric, EOMI Ears: Normal auditory acuity Mouth: No deformity or lesions Neck: Supple, no masses or thyromegaly Lungs: Clear  throughout to auscultation Heart: Regular rate and rhythm; no murmurs, rubs or bruits Abdomen: Soft, non tender and non distended. No masses, hepatosplenomegaly or hernias noted. Normal bowel sounds Rectal: Deferred to colonoscopy Musculoskeletal: Symmetrical with no gross deformities  Skin: No lesions on visible extremities Pulses:  Normal pulses noted Extremities: No clubbing, cyanosis, edema or deformities noted Neurological: Alert oriented x 4, grossly nonfocal Cervical Nodes:  No significant cervical adenopathy Inguinal Nodes: No significant inguinal adenopathy Psychological:  Alert and cooperative. Normal mood and affect  Assessment and Recommendations:  1. Lower abdominal pain/pelvic pain, lower back pain, bloating-all have resolved. CT scan showed cholelithiasis with no other gastrointestinal or GYN findings. Symptoms are not typical for cholelithiasis. All symptoms have resolved so will plan expectant management.   2. Family history of rectal cancer in her mother at age 49. She is overdue for screening colonoscopy. Schedule colonoscopy. The risks (including bleeding, perforation, infection, missed lesions, medication reactions and possible hospitalization or surgery if complications occur), benefits, and alternatives to colonoscopy with possible biopsy and possible polypectomy were discussed with the patient and they consent to proceed.   3. Asymptomatic cholelithiasis. Expectant management.   cc: Marcelle OverlieMichelle Grewal, MD [address] [place], [place] [id number]

## 2016-04-25 NOTE — Patient Instructions (Addendum)
You have been scheduled for a colonoscopy. Please follow written instructions given to you at your visit today.  Please pick up your prep supplies at the pharmacy within the next 1-3 days. If you use inhalers (even only as needed), please bring them with you on the day of your procedure. Your physician has requested that you go to www.startemmi.com and enter the access code given to you at your visit today. This web site gives a general overview about your procedure. However, you should still follow specific instructions given to you by our office regarding your preparation for the procedure.  Normal BMI (Body Mass Index- based on height and weight) is between 19 and 25. Your BMI today is Body mass index is 36.64 kg/(m^2). Marland Kitchen. Please consider follow up  regarding your BMI with your Primary Care Provider.  Thank you for choosing me and [facility].  Venita LickMalcolm T. Pleas KochStark, Jr., MD., Clementeen GrahamFACG

## 2016-06-05 ENCOUNTER — Encounter: Payer: Self-pay | Admitting: Gastroenterology

## 2016-06-11 ENCOUNTER — Ambulatory Visit (AMBULATORY_SURGERY_CENTER): Payer: Federal, State, Local not specified - PPO | Admitting: Gastroenterology

## 2016-06-11 ENCOUNTER — Encounter: Payer: Self-pay | Admitting: Gastroenterology

## 2016-06-11 VITALS — BP 105/57 | HR 72 | Resp 14 | Ht 67.0 in | Wt 234.0 lb

## 2016-06-11 DIAGNOSIS — Z800 Family history of malignant neoplasm of digestive organs: Secondary | ICD-10-CM

## 2016-06-11 DIAGNOSIS — Z1211 Encounter for screening for malignant neoplasm of colon: Secondary | ICD-10-CM

## 2016-06-11 MED ORDER — SODIUM CHLORIDE 0.9 % IV SOLN: 500.0000 mL | INTRAVENOUS | Status: DC

## 2016-06-11 NOTE — Op Note (Signed)
[facility] Patient Name: Bethany MelnickSandra Spies Procedure Date: 06/11/2016 3:01 PM MRN: [id number] Endoscopist: Meryl DareMalcolm T Elan Brainerd , MD Age: [age] Referring MD:  Date of Birth: 1966-08-06 Gender: Female Account #: [id number] Procedure:                Colonoscopy Indications:              Screening in patient at increased risk: Family                            history of 1st-degree relative with colorectal                            cancer before age 49 years Medicines:                Monitored Anesthesia Care Procedure:                Pre-Anesthesia Assessment:                           - Prior to the procedure, a History and Physical                            was performed, and patient medications and                            allergies were reviewed. The patient's tolerance of                            previous anesthesia was also reviewed. The risks                            and benefits of the procedure and the sedation                            options and risks were discussed with the patient.                            All questions were answered, and informed consent                            was obtained. Prior Anticoagulants: The patient has                            taken no previous anticoagulant or antiplatelet                            agents. ASA Grade Assessment: II - A patient with                            mild systemic disease. After reviewing the risks                            and benefits, the patient was deemed in  satisfactory condition to undergo the procedure.                           After obtaining informed consent, the colonoscope                            was passed under direct vision. Throughout the                            procedure, the patient's blood pressure, pulse, and                            oxygen saturations were monitored continuously. The                            Model PCF-H190L [id number](SN#[id number]) scope  was introduced                            through the anus and advanced to the the cecum,                            identified by appendiceal orifice and ileocecal                            valve. The ileocecal valve, appendiceal orifice,                            and rectum were photographed. The quality of the                            bowel preparation was good. The colonoscopy was                            performed without difficulty. The patient tolerated                            the procedure well. Scope In: 3:15:36 PM Scope Out: 3:27:15 PM Scope Withdrawal Time: 0 hours 10 minutes 3 seconds  Total Procedure Duration: 0 hours 11 minutes 39 seconds  Findings:                 The entire examined colon appeared normal on direct                            and retroflexion views.                           The perianal exam findings include small                            non-thrombosed external hemorrhoids/tags. Complications:            No immediate complications. Estimated blood loss:                            None. Estimated Blood  Loss:     Estimated blood loss: none. Impression:               - The entire examined colon is normal on direct and                            retroflexion views.                           - Non-thrombosed external hemorrhoids/tags found on                            perianal exam. Recommendation:           - Repeat colonoscopy in 5 years for screening                            purposes.                           - Patient has a contact number available for                            emergencies. The signs and symptoms of potential                            delayed complications were discussed with the                            patient. Return to normal activities tomorrow.                            Written discharge instructions were provided to the                            patient.                           - Resume previous diet.                            - Continue present medications. Meryl Dare, MD 06/11/2016 3:31:41 PM This report has been signed electronically.

## 2016-06-11 NOTE — Progress Notes (Signed)
Please call her mobile number tomorrow for f/u per patient.

## 2016-06-11 NOTE — Progress Notes (Addendum)
Patient stating she took only 1/2 of her prep this am. Patient stating he  Hemorrhoids were  hurting. Patient stating she can see through the prep results.  Translucent yellow in appearance. Patient stating she did not take all the prep related to painful hemorrhoids. Informed Dr Russella DarStark.

## 2016-06-11 NOTE — Patient Instructions (Signed)
YOU HAD AN ENDOSCOPIC PROCEDURE TODAY AT THE [facility]:   Refer to the procedure report that was given to you for any specific questions about what was found during the examination.  If the procedure report does not answer your questions, please call your gastroenterologist to clarify.  If you requested that your care partner not be given the details of your procedure findings, then the procedure report has been included in a sealed envelope for you to review at your convenience later.  YOU SHOULD EXPECT: Some feelings of bloating in the abdomen. Passage of more gas than usual.  Walking can help get rid of the air that was put into your GI tract during the procedure and reduce the bloating. If you had a lower endoscopy (such as a colonoscopy or flexible sigmoidoscopy) you may notice spotting of blood in your stool or on the toilet paper. If you underwent a bowel prep for your procedure, you may not have a normal bowel movement for a few days.  Please Note:  You might notice some irritation and congestion in your nose or some drainage.  This is from the oxygen used during your procedure.  There is no need for concern and it should clear up in a day or so.  SYMPTOMS TO REPORT IMMEDIATELY:   Following lower endoscopy (colonoscopy or flexible sigmoidoscopy):  Excessive amounts of blood in the stool  Significant tenderness or worsening of abdominal pains  Swelling of the abdomen that is new, acute  Fever of 100F or higher   For urgent or emergent issues, a gastroenterologist can be reached at any hour by calling [phone].   DIET: Your first meal following the procedure should be a small meal and then it is ok to progress to your normal diet. Heavy or fried foods are harder to digest and may make you feel nauseous or bloated.  Likewise, meals heavy in dairy and vegetables can increase bloating.  Drink plenty of fluids but you should avoid alcoholic beverages for 24 hours. Try to  increase the fiber in your diet, and drink plenty of water.  ACTIVITY:  You should plan to take it easy for the rest of today and you should NOT DRIVE or use heavy machinery until tomorrow (because of the sedation medicines used during the test).    FOLLOW UP: Our staff will call the number listed on your records the next business day following your procedure to check on you and address any questions or concerns that you may have regarding the information given to you following your procedure. If we do not reach you, we will leave a message.  However, if you are feeling well and you are not experiencing any problems, there is no need to return our call.  We will assume that you have returned to your regular daily activities without incident.  If any biopsies were taken you will be contacted by phone or by letter within the next 1-3 weeks.  Please call us at [phone] if you have not heard about the biopsies in 3 weeks.    SIGNATURES/CONFIDENTIALITY: You and/or your care partner have signed paperwork which will be entered into your electronic medical record.  These signatures attest to the fact that that the information above on your After Visit Summary has been reviewed and is understood.  Full responsibility of the confidentiality of this discharge information lies with you and/or your care-partner.  Read all of the handouts given to you by your recovery   room nurse.  Thank-you for choosing us for your healthcare needs today. 

## 2016-06-11 NOTE — Progress Notes (Signed)
A/ox3, pleased with MAC, report to RN 

## 2016-06-12 ENCOUNTER — Telehealth: Payer: Self-pay | Admitting: *Deleted

## 2016-06-12 NOTE — Telephone Encounter (Signed)
No answer, left message to call if questions or concerns. 

## 2016-08-06 ENCOUNTER — Ambulatory Visit (INDEPENDENT_AMBULATORY_CARE_PROVIDER_SITE_OTHER): Payer: Federal, State, Local not specified - PPO | Admitting: Physician Assistant

## 2016-08-06 VITALS — BP 114/80 | HR 72 | Temp 98.3°F | Resp 16 | Wt 232.0 lb

## 2016-08-06 DIAGNOSIS — L239 Allergic contact dermatitis, unspecified cause: Secondary | ICD-10-CM

## 2016-08-06 DIAGNOSIS — L200 Besnier's prurigo: Secondary | ICD-10-CM | POA: Diagnosis not present

## 2016-08-06 MED ORDER — PREDNISONE 20 MG PO TABS: ORAL_TABLET | ORAL | 0 refills | Status: DC

## 2016-08-06 NOTE — Progress Notes (Signed)
    Patient ID: Bethany Hernandez MRN: [id number], DOB: 11/16/1966, 50 y.o. Date of Encounter: 08/06/2016, 11:34 AM    Chief Complaint:  Chief Complaint  Patient presents with  . Insect Bite    right eye 2 days ago, also has bites on right arm     HPI: 50 y.o. year old white female says that on Saturday 08/04/16 some kind of insect bit her. She got a bite on her right cheek just right under her right eye. Also got multiple bites on her right upper arm. Says that on Saturday the site on her right cheek was just itchy and was not swollen etc. However that night while she was sleeping it did swell so yesterday morning when she woke up with swelling under the right eye. Says that she had the same amount of swelling yesterday morning as she has today. It is not better or worse -- but about the same. Took one dose of Benadryl yesterday. Made her very drowsy but otherwise did not see much improvement. She has had no difficulty breathing. Has not felt like she is wheezing and has not felt like her throat is tight itchy or closing. No other complaints or concerns.     Home Meds:   Outpatient Medications Prior to Visit  Medication Sig Dispense Refill  . Cholecalciferol (VITAMIN D3) 5000 units CAPS Take by mouth.    . fluvoxaMINE (LUVOX) 100 MG tablet Take 100 mg by mouth at bedtime.    Marland Kitchen. buPROPion (WELLBUTRIN XL) 150 MG 24 hr tablet Take 300 mg by mouth daily.      No facility-administered medications prior to visit.     Allergies:  Allergies  Allergen Reactions  . Sulfonamide Derivatives Hives and Other (See Comments)    All over the body      Review of Systems: See HPI for pertinent ROS. All other ROS negative.    Physical Exam: Blood pressure 114/80, pulse 72, temperature 98.3 F (36.8 C), temperature source Oral, resp. rate 16, weight 232 lb (105.2 kg)., Body mass index is 36.34 kg/m. General:  WF. Appears in no acute distress. Neck: Supple. No thyromegaly. No  lymphadenopathy. Lungs: Clear bilaterally to auscultation without wheezes, rales, or rhonchi. Breathing is unlabored. Heart: Regular rhythm. No murmurs, rubs, or gallops. Msk:  Strength and tone normal for age. Skin: Face: Right upper cheek -- mild swelling. Minimal erythema.  Right upper arm, starting at level of elbow and extending upward----there are multiple sites c/w insect bites / mosquito bites---each has dot in center surrounded by ~ 1cm diameter of swelling and light pink erythema. Neuro: Alert and oriented X 3. Moves all extremities spontaneously. Gait is normal. CNII-XII grossly in tact. Psych:  Responds to questions appropriately with a normal affect.     ASSESSMENT AND PLAN:  50 y.o. year old female with  1. Allergic dermatitis She is to start the prednisone taper immediately and take as directed. She is to also take Benadryl in conjunction with this. Follow up if site worsens or is not improving in 24 hours. - predniSONE (DELTASONE) 20 MG tablet; Take 3 daily for 2 days, then 2 daily for 2 days, then 1 daily for 2 days.  Dispense: 12 tablet; Refill: 0   Signed, [address]Janine Reller Beth [place], [place], [facility] 08/06/2016 11:34 AM

## 2016-09-17 DIAGNOSIS — D2312 Other benign neoplasm of skin of left eyelid, including canthus: Secondary | ICD-10-CM | POA: Diagnosis not present

## 2016-09-17 DIAGNOSIS — L720 Epidermal cyst: Secondary | ICD-10-CM | POA: Diagnosis not present

## 2017-02-08 DIAGNOSIS — M009 Pyogenic arthritis, unspecified: Secondary | ICD-10-CM | POA: Diagnosis not present

## 2017-03-14 DIAGNOSIS — F411 Generalized anxiety disorder: Secondary | ICD-10-CM | POA: Diagnosis not present

## 2017-03-14 DIAGNOSIS — F422 Mixed obsessional thoughts and acts: Secondary | ICD-10-CM | POA: Diagnosis not present

## 2017-03-14 DIAGNOSIS — N943 Premenstrual tension syndrome: Secondary | ICD-10-CM | POA: Diagnosis not present

## 2017-08-27 DIAGNOSIS — D692 Other nonthrombocytopenic purpura: Secondary | ICD-10-CM | POA: Diagnosis not present

## 2017-08-27 DIAGNOSIS — L811 Chloasma: Secondary | ICD-10-CM | POA: Diagnosis not present

## 2017-08-27 DIAGNOSIS — L821 Other seborrheic keratosis: Secondary | ICD-10-CM | POA: Diagnosis not present

## 2017-08-28 DIAGNOSIS — F411 Generalized anxiety disorder: Secondary | ICD-10-CM | POA: Diagnosis not present

## 2017-08-28 DIAGNOSIS — N943 Premenstrual tension syndrome: Secondary | ICD-10-CM | POA: Diagnosis not present

## 2017-08-28 DIAGNOSIS — F422 Mixed obsessional thoughts and acts: Secondary | ICD-10-CM | POA: Diagnosis not present

## 2017-09-09 DIAGNOSIS — Z6835 Body mass index (BMI) 35.0-35.9, adult: Secondary | ICD-10-CM | POA: Diagnosis not present

## 2017-09-09 DIAGNOSIS — Z1231 Encounter for screening mammogram for malignant neoplasm of breast: Secondary | ICD-10-CM | POA: Diagnosis not present

## 2017-09-09 DIAGNOSIS — Z01419 Encounter for gynecological examination (general) (routine) without abnormal findings: Secondary | ICD-10-CM | POA: Diagnosis not present

## 2017-09-10 ENCOUNTER — Other Ambulatory Visit: Payer: Self-pay | Admitting: Obstetrics and Gynecology

## 2017-09-10 DIAGNOSIS — R928 Other abnormal and inconclusive findings on diagnostic imaging of breast: Secondary | ICD-10-CM

## 2017-09-17 ENCOUNTER — Ambulatory Visit
Admission: RE | Admit: 2017-09-17 | Discharge: 2017-09-17 | Disposition: A | Discharge status: Home / Self Care | Payer: Federal, State, Local not specified - PPO | Source: Ambulatory Visit | Attending: Obstetrics and Gynecology | Admitting: Obstetrics and Gynecology

## 2017-09-17 DIAGNOSIS — R928 Other abnormal and inconclusive findings on diagnostic imaging of breast: Secondary | ICD-10-CM | POA: Diagnosis not present

## 2017-09-17 DIAGNOSIS — Z79899 Other long term (current) drug therapy: Secondary | ICD-10-CM | POA: Diagnosis not present

## 2018-01-01 DIAGNOSIS — F411 Generalized anxiety disorder: Secondary | ICD-10-CM | POA: Diagnosis not present

## 2018-01-01 DIAGNOSIS — F422 Mixed obsessional thoughts and acts: Secondary | ICD-10-CM | POA: Diagnosis not present

## 2018-01-01 DIAGNOSIS — N7689 Other specified inflammation of vagina and vulva: Secondary | ICD-10-CM | POA: Diagnosis not present

## 2018-01-01 DIAGNOSIS — N943 Premenstrual tension syndrome: Secondary | ICD-10-CM | POA: Diagnosis not present

## 2018-02-08 DIAGNOSIS — E871 Hypo-osmolality and hyponatremia: Secondary | ICD-10-CM | POA: Diagnosis not present

## 2018-02-08 DIAGNOSIS — N10 Acute tubulo-interstitial nephritis: Secondary | ICD-10-CM | POA: Diagnosis not present

## 2018-02-08 DIAGNOSIS — N132 Hydronephrosis with renal and ureteral calculous obstruction: Secondary | ICD-10-CM | POA: Diagnosis not present

## 2018-02-08 DIAGNOSIS — Z6839 Body mass index (BMI) 39.0-39.9, adult: Secondary | ICD-10-CM | POA: Diagnosis not present

## 2018-02-08 DIAGNOSIS — N390 Urinary tract infection, site not specified: Secondary | ICD-10-CM | POA: Diagnosis not present

## 2018-02-08 DIAGNOSIS — A419 Sepsis, unspecified organism: Secondary | ICD-10-CM | POA: Diagnosis not present

## 2018-02-08 DIAGNOSIS — B9620 Unspecified Escherichia coli [E. coli] as the cause of diseases classified elsewhere: Secondary | ICD-10-CM | POA: Diagnosis not present

## 2018-02-08 DIAGNOSIS — E669 Obesity, unspecified: Secondary | ICD-10-CM | POA: Diagnosis not present

## 2018-02-08 DIAGNOSIS — N3289 Other specified disorders of bladder: Secondary | ICD-10-CM | POA: Diagnosis not present

## 2018-02-27 DIAGNOSIS — N201 Calculus of ureter: Secondary | ICD-10-CM | POA: Diagnosis not present

## 2018-04-02 DIAGNOSIS — F422 Mixed obsessional thoughts and acts: Secondary | ICD-10-CM | POA: Diagnosis not present

## 2018-04-02 DIAGNOSIS — N943 Premenstrual tension syndrome: Secondary | ICD-10-CM | POA: Diagnosis not present

## 2018-04-02 DIAGNOSIS — F411 Generalized anxiety disorder: Secondary | ICD-10-CM | POA: Diagnosis not present

## 2018-07-02 DIAGNOSIS — F422 Mixed obsessional thoughts and acts: Secondary | ICD-10-CM | POA: Diagnosis not present

## 2018-07-02 DIAGNOSIS — F411 Generalized anxiety disorder: Secondary | ICD-10-CM | POA: Diagnosis not present

## 2018-07-02 DIAGNOSIS — N943 Premenstrual tension syndrome: Secondary | ICD-10-CM | POA: Diagnosis not present

## 2018-07-08 ENCOUNTER — Ambulatory Visit: Payer: Federal, State, Local not specified - PPO | Admitting: Family Medicine

## 2018-07-08 ENCOUNTER — Encounter: Payer: Self-pay | Admitting: Family Medicine

## 2018-07-08 VITALS — BP 112/72 | HR 72 | Temp 98.5°F | Resp 12 | Ht 67.0 in | Wt 225.0 lb

## 2018-07-08 DIAGNOSIS — J3489 Other specified disorders of nose and nasal sinuses: Secondary | ICD-10-CM

## 2018-07-08 DIAGNOSIS — H1033 Unspecified acute conjunctivitis, bilateral: Secondary | ICD-10-CM | POA: Diagnosis not present

## 2018-07-08 MED ORDER — POLYMYXIN B-TRIMETHOPRIM 10000-0.1 UNIT/ML-% OP SOLN: 2.0000 [drp] | OPHTHALMIC | 0 refills | Status: DC

## 2018-07-08 NOTE — Progress Notes (Signed)
Subjective:    Patient ID: Bethany Hernandez, female    DOB: [birth year], 52 y.o.   MRN: [id number]  HPI Patient developed red eyes yesterday.  The conjunctiva bilaterally is injected, fiery red.  She denies any blurry vision.  She denies any pain in her eyes.  She reports copious watery discharge.  She reports itching .  She also reports head congestion and rhinorrhea.  She denies any sinus pain.  She does report sinus pressure and postnasal drip.  Symptoms began roughly 2 days ago.  She denies any previous history of allergies or allergic conjunctivitis.  She denies any purulent drainage. Past Medical History:  Diagnosis Date  . Anxiety   . Depression   . Hemorrhoids    Past Surgical History:  Procedure Laterality Date  . BLADDER SURGERY    . SPINAL FUSION     Current Outpatient Medications on File Prior to Visit  Medication Sig Dispense Refill  . buPROPion (WELLBUTRIN XL) 300 MG 24 hr tablet Take 150 mg by mouth 2 (two) times daily. Takes 1/2 300 mg tablet BID    . fluvoxaMINE (LUVOX) 100 MG tablet Take 100 mg by mouth at bedtime.     No current facility-administered medications on file prior to visit.    Allergies  Allergen Reactions  . Sulfonamide Derivatives Hives and Other (See Comments)    All over the body   Social History   Socioeconomic History  . Marital status: Married    Spouse name: Not on file  . Number of children: 2  . Years of education: Not on file  . Highest education level: Not on file  Occupational History  . Occupation: Data processing manager: [employer]  Social Needs  . Financial resource strain: Not on file  . Food insecurity:    Worry: Not on file    Inability: Not on file  . Transportation needs:    Medical: Not on file    Non-medical: Not on file  Tobacco Use  . Smoking status: Never Smoker  . Smokeless tobacco: Never Used  Substance and Sexual Activity  . Alcohol use: No  . Drug use: No  . Sexual activity: Yes  Lifestyle  . Physical  activity:    Days per week: Not on file    Minutes per session: Not on file  . Stress: Not on file  Relationships  . Social connections:    Talks on phone: Not on file    Gets together: Not on file    Attends religious service: Not on file    Active member of club or organization: Not on file    Attends meetings of clubs or organizations: Not on file    Relationship status: Not on file  . Intimate partner violence:    Fear of current or ex partner: Not on file    Emotionally abused: Not on file    Physically abused: Not on file    Forced sexual activity: Not on file  Other Topics Concern  . Not on file  Social History Narrative  . Not on file      Review of Systems  All other systems reviewed and are negative.      Objective:   Physical Exam  HENT:  Nose: Mucosal edema and rhinorrhea present. Right sinus exhibits no maxillary sinus tenderness and no frontal sinus tenderness. Left sinus exhibits no maxillary sinus tenderness and no frontal sinus tenderness.  Eyes: Right eye exhibits discharge. Left eye  exhibits discharge. Right conjunctiva is injected. Left conjunctiva is injected.  Cardiovascular: Normal rate, regular rhythm and normal heart sounds.  Pulmonary/Chest: Effort normal and breath sounds normal. No stridor. No respiratory distress. She has no wheezes.  Vitals reviewed.         Assessment & Plan:  Rhinorrhea  Acute conjunctivitis of both eyes, unspecified acute conjunctivitis type  Patient certainly has conjunctivitis.  I believe based on her symptoms it is more likely viral versus allergic.  I have recommended Xyzal 5 mg p.o. daily.  I believe this will help with any allergies, rhinorrhea, and head congestion.  If it is viral conjunctivitis and needs tincture of time.  However given the copious discharge that she is experiencing, I did give her Polytrim drops, 2 drops every 4-6 hours while awake bilaterally until eyes are clear.  We discussed contact  precautions.

## 2018-07-15 ENCOUNTER — Other Ambulatory Visit: Payer: Self-pay

## 2018-07-15 ENCOUNTER — Ambulatory Visit: Payer: Federal, State, Local not specified - PPO | Admitting: Family Medicine

## 2018-07-15 ENCOUNTER — Encounter: Payer: Self-pay | Admitting: Family Medicine

## 2018-07-15 VITALS — BP 110/84 | HR 77 | Temp 98.3°F | Resp 14 | Ht 67.0 in | Wt 227.0 lb

## 2018-07-15 DIAGNOSIS — J029 Acute pharyngitis, unspecified: Secondary | ICD-10-CM | POA: Diagnosis not present

## 2018-07-15 DIAGNOSIS — H9202 Otalgia, left ear: Secondary | ICD-10-CM

## 2018-07-15 DIAGNOSIS — H6122 Impacted cerumen, left ear: Secondary | ICD-10-CM

## 2018-07-15 NOTE — Progress Notes (Signed)
Patient ID: Bethany Hernandez, female    DOB: 10-25-1966, 52 y.o.   MRN: [id number]  PCP: Donita Brooks, MD  Chief Complaint  Patient presents with  . Ear Pain    symptoms for 1 week  . Sore Throat    Subjective:   Bethany Hernandez is a 52 y.o. female, presents to clinic with CC of sore throat for 1 to 2 weeks and left ear pain for 1 week.  She was seen last week and diagnosed with URI and treated for conjunctivitis.  Her nasal congestion and eye redness and drainage improved but she developed sore scratchy throat that feels swollen and has gradually worsened.  He also has left ear pain around her ear with decreased hearing and pressure sensation.  She has no drainage or swelling of the outer ear.  No fever, chills, sweats, vertigo.   Patient Active Problem List   Diagnosis Date Noted  . Family history of colon cancer 04/25/2016  . Calculus of gallbladder and bile duct without cholecystitis or obstruction 04/25/2016     Prior to Admission medications   Medication Sig Start Date End Date Taking? Authorizing Provider  buPROPion (WELLBUTRIN XL) 300 MG 24 hr tablet Take 150 mg by mouth 2 (two) times daily. Takes 1/2 300 mg tablet BID 05/20/16  Yes [provider]  fluvoxaMINE (LUVOX) 100 MG tablet Take 100 mg by mouth at bedtime.   Yes [provider]  trimethoprim-polymyxin b (POLYTRIM) ophthalmic solution Place 2 drops into both eyes every 4 (four) hours. 07/08/18  Yes Donita Brooks, MD     Allergies  Allergen Reactions  . Sulfonamide Derivatives Hives and Other (See Comments)    All over the body     Family History  Problem Relation Age of Onset  . Rectal cancer Mother   . COPD Father      Social History   Socioeconomic History  . Marital status: Married    Spouse name: Not on file  . Number of children: 2  . Years of education: Not on file  . Highest education level: Not on file  Occupational History  . Occupation: Scientist, water quality: [employer]  Social Needs  . Financial resource strain: Not on file  . Food insecurity:    Worry: Not on file    Inability: Not on file  . Transportation needs:    Medical: Not on file    Non-medical: Not on file  Tobacco Use  . Smoking status: Never Smoker  . Smokeless tobacco: Never Used  Substance and Sexual Activity  . Alcohol use: No  . Drug use: No  . Sexual activity: Yes  Lifestyle  . Physical activity:    Days per week: Not on file    Minutes per session: Not on file  . Stress: Not on file  Relationships  . Social connections:    Talks on phone: Not on file    Gets together: Not on file    Attends religious service: Not on file    Active member of club or organization: Not on file    Attends meetings of clubs or organizations: Not on file    Relationship status: Not on file  . Intimate partner violence:    Fear of current or ex partner: Not on file    Emotionally abused: Not on file    Physically abused: Not on file    Forced sexual activity: Not on file  Other Topics Concern  .  Not on file  Social History Narrative  . Not on file     Review of Systems  Constitutional: Negative.   HENT: Negative.   Eyes: Negative.   Respiratory: Negative.   Cardiovascular: Negative.   Gastrointestinal: Negative.   Endocrine: Negative.   Genitourinary: Negative.   Musculoskeletal: Negative.   Skin: Negative.   Allergic/Immunologic: Negative.   Neurological: Negative.   Hematological: Negative.   Psychiatric/Behavioral: Negative.   All other systems reviewed and are negative.      Objective:    Vitals:   07/15/18 0804  BP: 110/84  Pulse: 77  Resp: 14  Temp: 98.3 F (36.8 C)  TempSrc: Oral  SpO2: 97%  Weight: 227 lb (103 kg)  Height: 5\' 7"  (1.702 m)      Physical Exam  Constitutional: She appears well-developed and well-nourished.  Non-toxic appearance. She does not appear ill. No distress.  HENT:  Head: Normocephalic and atraumatic.  Right Ear:  Hearing, tympanic membrane and ear canal normal. No drainage, swelling or tenderness. No middle ear effusion.  Left Ear: Hearing normal. There is swelling. No drainage or tenderness.  Nose: Nose normal.  Mouth/Throat: Uvula is midline and mucous membranes are normal. Mucous membranes are not pale and not dry. No uvula swelling. Posterior oropharyngeal erythema present. No oropharyngeal exudate, posterior oropharyngeal edema or tonsillar abscesses. No tonsillar exudate.  Eyes: Pupils are equal, round, and reactive to light. Conjunctivae and EOM are normal. Right eye exhibits no discharge. Left eye exhibits no discharge.  Neck: Normal range of motion. Neck supple. No tracheal deviation present.  Cardiovascular: Normal rate, regular rhythm, normal heart sounds and intact distal pulses. Exam reveals no gallop and no friction rub.  No murmur heard. Pulmonary/Chest: Effort normal and breath sounds normal. No stridor. No respiratory distress. She has no wheezes. She has no rhonchi. She has no rales.  Musculoskeletal: Normal range of motion.  Lymphadenopathy:    She has no cervical adenopathy.  Neurological: She is alert. She exhibits normal muscle tone. Coordination normal.  Skin: Skin is warm and dry. No rash noted.  Psychiatric: She has a normal mood and affect. Her behavior is normal.  Nursing note and vitals reviewed.    Procedure: Cerumen Disimpaction - left  Gentle ear lavage with warm water and hydrogen peroxide performed on the left. There were no complications and following the disimpaction the tympanic membrane was visible on the left. Tympanic membranes are intact following the procedure.  Auditory canals are grossly normal.  The patient reported moderate improvement of symptoms after removal of cerumen.       Assessment & Plan:      ICD-10-CM   1. Pharyngitis, unspecified etiology J02.9 STREP GROUP A AG, W/REFLEX TO CULT  2. Acute otalgia, left H92.02   3. Impacted cerumen of left  ear H61.22     Pharyngitis - suspect viral Left ear pain/pressure with sore throat and nasal discharge/postnasal drip.  Last week seen for URI and conjunctivitis.  She has not taken zyxal daily, but eyes are better after treating with prescribed abx drops.  She has left ear pain that is located preauricularly and along jaw, suspect some referred pain from pharyngitis and/or eustachian tube dysfunction, plus left ear has obstructing cerumen.   successful irrigation of obstructing cerumen, pt tolerated.  Rapid strep negative  Tx with zyxal daily and steroid nasal spray, OTC analgesics as needed, throat culture pending, follow-up as needed   Danelle BerryLeisa Genia Perin, PA-C 07/15/18 8:26 AM

## 2018-07-15 NOTE — Patient Instructions (Addendum)
Use the Xyzal 5 mg p.o. Daily for the next several weeks. I would also start a steroid nasal spray daily to help shrink down swollen nasal tissue and it will help prevent post nasal drip.  (you can get over the counter/generic flonase or nasonex).  The rapid strep test was negative.  The culture will result in a few days and we will call you with results.  I believe your ear and throat pain are more likely related to allergies or a virus, and these do not need treatment with an antibiotic.  If the throat culture is positive we will prescribe your the indicated antibiotic treatment.  See the info below on ear wax.  Do not use q-tips inside ear canal.   Try over the counter Debrox ear wax softening drops.   Earwax Buildup, Adult The ears produce a substance called earwax that helps keep bacteria out of the ear and protects the skin in the ear canal. Occasionally, earwax can build up in the ear and cause discomfort or hearing loss. What increases the risk? This condition is more likely to develop in people who:  Are female.  Are elderly.  Naturally produce more earwax.  Clean their ears often with cotton swabs.  Use earplugs often.  Use in-ear headphones often.  Wear hearing aids.  Have narrow ear canals.  Have earwax that is overly thick or sticky.  Have eczema.  Are dehydrated.  Have excess hair in the ear canal.  What are the signs or symptoms? Symptoms of this condition include:  Reduced or muffled hearing.  A feeling of fullness in the ear or feeling that the ear is plugged.  Fluid coming from the ear.  Ear pain.  Ear itch.  Ringing in the ear.  Coughing.  An obvious piece of earwax that can be seen inside the ear canal.  How is this diagnosed? This condition may be diagnosed based on:  Your symptoms.  Your medical history.  An ear exam. During the exam, your health care provider will look into your ear with an instrument called an otoscope.  You may  have tests, including a hearing test. How is this treated? This condition may be treated by:  Using ear drops to soften the earwax.  Having the earwax removed by a health care provider. The health care provider may: ? Flush the ear with water. ? Use an instrument that has a loop on the end (curette). ? Use a suction device.  Surgery to remove the wax buildup. This may be done in severe cases.  Follow these instructions at home:  Take over-the-counter and prescription medicines only as told by your health care provider.  Do not put any objects, including cotton swabs, into your ear. You can clean the opening of your ear canal with a washcloth or facial tissue.  Follow instructions from your health care provider about cleaning your ears. Do not over-clean your ears.  Drink enough fluid to keep your urine clear or pale yellow. This will help to thin the earwax.  Keep all follow-up visits as told by your health care provider. If earwax builds up in your ears often or if you use hearing aids, consider seeing your health care provider for routine, preventive ear cleanings. Ask your health care provider how often you should schedule your cleanings.  If you have hearing aids, clean them according to instructions from the manufacturer and your health care provider. Contact a health care provider if:  You have ear  pain.  You develop a fever.  You have blood, pus, or other fluid coming from your ear.  You have hearing loss.  You have ringing in your ears that does not go away.  Your symptoms do not improve with treatment.  You feel like the room is spinning (vertigo). Summary  Earwax can build up in the ear and cause discomfort or hearing loss.  The most common symptoms of this condition include reduced or muffled hearing and a feeling of fullness in the ear or feeling that the ear is plugged.  This condition may be diagnosed based on your symptoms, your medical history, and an  ear exam.  This condition may be treated by using ear drops to soften the earwax or by having the earwax removed by a health care provider.  Do not put any objects, including cotton swabs, into your ear. You can clean the opening of your ear canal with a washcloth or facial tissue. This information is not intended to replace advice given to you by your health care provider. Make sure you discuss any questions you have with your health care provider. Document Released: 01/10/2005 Document Revised: 02/13/2017 Document Reviewed: 02/13/2017 Elsevier Interactive Patient Education  [employer].

## 2018-07-16 NOTE — Progress Notes (Signed)
Negative throat culture - pt will be notified

## 2018-07-17 LAB — CULTURE, GROUP A STREP
MICRO NUMBER: 90898814
SPECIMEN QUALITY:: ADEQUATE

## 2018-07-17 LAB — STREP GROUP A AG, W/REFLEX TO CULT: Streptococcus, Group A Screen (Direct): NOT DETECTED

## 2018-07-17 NOTE — Progress Notes (Signed)
Negative throat culture - pt will be notified

## 2018-08-13 ENCOUNTER — Encounter: Payer: Self-pay | Admitting: Family Medicine

## 2018-09-10 DIAGNOSIS — Z6836 Body mass index (BMI) 36.0-36.9, adult: Secondary | ICD-10-CM | POA: Diagnosis not present

## 2018-09-10 DIAGNOSIS — Z01419 Encounter for gynecological examination (general) (routine) without abnormal findings: Secondary | ICD-10-CM | POA: Diagnosis not present

## 2018-09-10 DIAGNOSIS — Z1231 Encounter for screening mammogram for malignant neoplasm of breast: Secondary | ICD-10-CM | POA: Diagnosis not present

## 2018-12-04 DIAGNOSIS — H5203 Hypermetropia, bilateral: Secondary | ICD-10-CM | POA: Diagnosis not present

## 2018-12-04 DIAGNOSIS — H01001 Unspecified blepharitis right upper eyelid: Secondary | ICD-10-CM | POA: Diagnosis not present

## 2018-12-04 DIAGNOSIS — H524 Presbyopia: Secondary | ICD-10-CM | POA: Diagnosis not present

## 2019-04-01 DIAGNOSIS — F422 Mixed obsessional thoughts and acts: Secondary | ICD-10-CM | POA: Diagnosis not present

## 2019-04-01 DIAGNOSIS — N943 Premenstrual tension syndrome: Secondary | ICD-10-CM | POA: Diagnosis not present

## 2019-04-01 DIAGNOSIS — F411 Generalized anxiety disorder: Secondary | ICD-10-CM | POA: Diagnosis not present

## 2019-07-15 DIAGNOSIS — F411 Generalized anxiety disorder: Secondary | ICD-10-CM | POA: Diagnosis not present

## 2019-07-15 DIAGNOSIS — F422 Mixed obsessional thoughts and acts: Secondary | ICD-10-CM | POA: Diagnosis not present

## 2019-07-15 DIAGNOSIS — N943 Premenstrual tension syndrome: Secondary | ICD-10-CM | POA: Diagnosis not present

## 2019-10-06 DIAGNOSIS — Z01419 Encounter for gynecological examination (general) (routine) without abnormal findings: Secondary | ICD-10-CM | POA: Diagnosis not present

## 2019-10-06 DIAGNOSIS — Z1231 Encounter for screening mammogram for malignant neoplasm of breast: Secondary | ICD-10-CM | POA: Diagnosis not present

## 2019-10-06 DIAGNOSIS — Z6836 Body mass index (BMI) 36.0-36.9, adult: Secondary | ICD-10-CM | POA: Diagnosis not present

## 2019-10-07 ENCOUNTER — Ambulatory Visit: Payer: Federal, State, Local not specified - PPO

## 2019-10-07 ENCOUNTER — Other Ambulatory Visit: Payer: Self-pay

## 2019-10-07 ENCOUNTER — Ambulatory Visit (INDEPENDENT_AMBULATORY_CARE_PROVIDER_SITE_OTHER): Payer: Federal, State, Local not specified - PPO

## 2019-10-07 DIAGNOSIS — Z01419 Encounter for gynecological examination (general) (routine) without abnormal findings: Secondary | ICD-10-CM | POA: Diagnosis not present

## 2019-10-07 DIAGNOSIS — Z23 Encounter for immunization: Secondary | ICD-10-CM | POA: Diagnosis not present

## 2019-10-13 DIAGNOSIS — Z13228 Encounter for screening for other metabolic disorders: Secondary | ICD-10-CM | POA: Diagnosis not present

## 2019-10-13 DIAGNOSIS — Z1329 Encounter for screening for other suspected endocrine disorder: Secondary | ICD-10-CM | POA: Diagnosis not present

## 2019-10-13 DIAGNOSIS — Z131 Encounter for screening for diabetes mellitus: Secondary | ICD-10-CM | POA: Diagnosis not present

## 2019-10-13 DIAGNOSIS — Z13220 Encounter for screening for lipoid disorders: Secondary | ICD-10-CM | POA: Diagnosis not present

## 2019-10-13 DIAGNOSIS — Z130 Encounter for screening for diseases of the blood and blood-forming organs and certain disorders involving the immune mechanism: Secondary | ICD-10-CM | POA: Diagnosis not present

## 2019-10-19 ENCOUNTER — Other Ambulatory Visit: Payer: Self-pay

## 2019-10-19 DIAGNOSIS — Z20822 Contact with and (suspected) exposure to covid-19: Secondary | ICD-10-CM

## 2019-10-20 LAB — NOVEL CORONAVIRUS, NAA: SARS-CoV-2, NAA: NOT DETECTED

## 2019-10-21 DIAGNOSIS — F422 Mixed obsessional thoughts and acts: Secondary | ICD-10-CM | POA: Diagnosis not present

## 2019-10-21 DIAGNOSIS — F411 Generalized anxiety disorder: Secondary | ICD-10-CM | POA: Diagnosis not present

## 2019-10-21 DIAGNOSIS — N943 Premenstrual tension syndrome: Secondary | ICD-10-CM | POA: Diagnosis not present

## 2020-01-04 DIAGNOSIS — Z809 Family history of malignant neoplasm, unspecified: Secondary | ICD-10-CM | POA: Diagnosis not present

## 2020-02-10 DIAGNOSIS — N943 Premenstrual tension syndrome: Secondary | ICD-10-CM | POA: Diagnosis not present

## 2020-02-10 DIAGNOSIS — F422 Mixed obsessional thoughts and acts: Secondary | ICD-10-CM | POA: Diagnosis not present

## 2020-02-10 DIAGNOSIS — F411 Generalized anxiety disorder: Secondary | ICD-10-CM | POA: Diagnosis not present

## 2020-05-02 DIAGNOSIS — R944 Abnormal results of kidney function studies: Secondary | ICD-10-CM | POA: Diagnosis not present

## 2020-05-02 DIAGNOSIS — E785 Hyperlipidemia, unspecified: Secondary | ICD-10-CM | POA: Diagnosis not present

## 2020-05-11 DIAGNOSIS — F411 Generalized anxiety disorder: Secondary | ICD-10-CM | POA: Diagnosis not present

## 2020-05-11 DIAGNOSIS — F422 Mixed obsessional thoughts and acts: Secondary | ICD-10-CM | POA: Diagnosis not present

## 2020-05-11 DIAGNOSIS — N943 Premenstrual tension syndrome: Secondary | ICD-10-CM | POA: Diagnosis not present

## 2020-05-12 ENCOUNTER — Other Ambulatory Visit: Payer: Self-pay

## 2020-05-12 ENCOUNTER — Ambulatory Visit (INDEPENDENT_AMBULATORY_CARE_PROVIDER_SITE_OTHER): Payer: Federal, State, Local not specified - PPO | Admitting: Nurse Practitioner

## 2020-05-12 VITALS — BP 118/80 | HR 76 | Temp 97.7°F | Resp 18 | Wt 223.6 lb

## 2020-05-12 DIAGNOSIS — R102 Pelvic and perineal pain: Secondary | ICD-10-CM

## 2020-05-12 DIAGNOSIS — R399 Unspecified symptoms and signs involving the genitourinary system: Secondary | ICD-10-CM

## 2020-05-12 DIAGNOSIS — N390 Urinary tract infection, site not specified: Secondary | ICD-10-CM

## 2020-05-12 DIAGNOSIS — N898 Other specified noninflammatory disorders of vagina: Secondary | ICD-10-CM

## 2020-05-12 LAB — URINALYSIS, ROUTINE W REFLEX MICROSCOPIC
Bilirubin Urine: NEGATIVE
Glucose, UA: NEGATIVE
Hgb urine dipstick: NEGATIVE
Hyaline Cast: NONE SEEN /LPF
Ketones, ur: NEGATIVE
Nitrite: NEGATIVE
Protein, ur: NEGATIVE
RBC / HPF: NONE SEEN /HPF (ref 0–2)
Specific Gravity, Urine: 1.01 (ref 1.001–1.03)
pH: 5.5 (ref 5.0–8.0)

## 2020-05-12 LAB — WET PREP FOR TRICH, YEAST, CLUE

## 2020-05-12 LAB — MICROSCOPIC MESSAGE

## 2020-05-12 MED ORDER — NITROFURANTOIN MONOHYD MACRO 100 MG PO CAPS: 100.0000 mg | ORAL_CAPSULE | Freq: Two times a day (BID) | ORAL | 0 refills | Status: AC

## 2020-05-12 NOTE — Progress Notes (Signed)
Established Patient Office Visit  Subjective:  Patient ID: Bethany Hernandez, female    DOB: 1966-04-01  Age: 54 y.o. MRN: [id number]  CC:  Chief Complaint  Patient presents with  . Vaginitis    burns, white discharge (creamy), started 05/26, no meds    HPI Bethany Hernandez is a 54 year old female presenting for symptoms that started yesterday pelvic pressure when she urinates and vaginal discharge she thinks that she may have to start yeast infection.  Patient denies fever, chills, hematuria.  No foul odor. No n/v/d. No tx tried.   Past Medical History:  Diagnosis Date  . Anxiety   . Depression   . Hemorrhoids     Past Surgical History:  Procedure Laterality Date  . BLADDER SURGERY    . SPINAL FUSION      Family History  Problem Relation Age of Onset  . Rectal cancer Mother   . COPD Father     Social History   Socioeconomic History  . Marital status: Married    Spouse name: Not on file  . Number of children: 2  . Years of education: Not on file  . Highest education level: Not on file  Occupational History  . Occupation: Data processing manager: [employer]  Tobacco Use  . Smoking status: Never Smoker  . Smokeless tobacco: Never Used  Substance and Sexual Activity  . Alcohol use: No  . Drug use: No  . Sexual activity: Yes  Other Topics Concern  . Not on file  Social History Narrative  . Not on file   Social Determinants of Health   Financial Resource Strain:   . Difficulty of Paying Living Expenses:   Food Insecurity:   . Worried About Programme researcher, broadcasting/film/video in the Last Year:   . Barista in the Last Year:   Transportation Needs:   . Freight forwarder (Medical):   Marland Kitchen Lack of Transportation (Non-Medical):   Physical Activity:   . Days of Exercise per Week:   . Minutes of Exercise per Session:   Stress:   . Feeling of Stress :   Social Connections:   . Frequency of Communication with Friends and Family:   . Frequency of Social Gatherings with  Friends and Family:   . Attends Religious Services:   . Active Member of Clubs or Organizations:   . Attends Banker Meetings:   Marland Kitchen Marital Status:   Intimate Partner Violence:   . Fear of Current or Ex-Partner:   . Emotionally Abused:   Marland Kitchen Physically Abused:   . Sexually Abused:     Outpatient Medications Prior to Visit  Medication Sig Dispense Refill  . buPROPion (WELLBUTRIN XL) 300 MG 24 hr tablet Take 150 mg by mouth 2 (two) times daily. Takes 1/2 300 mg tablet BID    . fluvoxaMINE (LUVOX) 100 MG tablet Take 100 mg by mouth at bedtime.    Marland Kitchen trimethoprim-polymyxin b (POLYTRIM) ophthalmic solution Place 2 drops into both eyes every 4 (four) hours. 10 mL 0   No facility-administered medications prior to visit.    Allergies  Allergen Reactions  . Sulfonamide Derivatives Hives and Other (See Comments)    All over the body    ROS Review of Systems  All other systems reviewed and are negative.     Objective:    Physical Exam  Constitutional: She is oriented to person, place, and time. She appears well-developed and well-nourished.  HENT:  Head: Normocephalic.  Eyes: Pupils are equal, round, and reactive to light. Conjunctivae and EOM are normal.  Neck: No JVD present.  Cardiovascular: Normal rate.  Pulmonary/Chest: Effort normal.  Abdominal: Soft. There is no CVA tenderness.  Genitourinary:    Vagina normal.     No vaginal discharge.   Musculoskeletal:     Cervical back: Normal range of motion.  Neurological: She is alert and oriented to person, place, and time.  Skin: Skin is warm and dry. No erythema.  Psychiatric: She has a normal mood and affect. Her behavior is normal. Thought content normal.  Nursing note and vitals reviewed.   BP 118/80 (BP Location: Right Arm, Patient Position: Sitting, Cuff Size: Normal)   Pulse 76   Temp 97.7 F (36.5 C) (Temporal)   Resp 18   Wt 223 lb 9.6 oz (101.4 kg)   SpO2 99%   BMI 35.02 kg/m  Wt Readings from  Last 3 Encounters:  05/12/20 223 lb 9.6 oz (101.4 kg)  07/15/18 227 lb (103 kg)  07/08/18 225 lb (102.1 kg)     Health Maintenance Due  Topic Date Due  . COVID-19 Vaccine (1) Never done  . HIV Screening  Never done  . TETANUS/TDAP  Never done  . PAP SMEAR-Modifier  Never done  . MAMMOGRAM  09/18/2019    There are no preventive care reminders to display for this patient.  No results found for: TSH Lab Results  Component Value Date   WBC 7.7 08/12/2014   HGB 14.3 08/12/2014   HCT 43.7 08/12/2014   MCV 89.2 08/12/2014   PLT 211 08/12/2014   Lab Results  Component Value Date   NA 141 08/12/2014   K 5.1 08/12/2014   CO2 23 08/12/2014   GLUCOSE 92 08/12/2014   BUN 16 08/12/2014   CREATININE 0.98 08/12/2014   BILITOT <0.2 (L) 08/12/2014   ALKPHOS 32 (L) 08/12/2014   AST 27 08/12/2014   ALT 19 08/12/2014   PROT 7.0 08/12/2014   ALBUMIN 3.5 08/12/2014   CALCIUM 8.6 08/12/2014   ANIONGAP 14 08/12/2014   GFR 54.32 (L) 07/02/2012   No results found for: CHOL No results found for: HDL No results found for: LDLCALC No results found for: TRIG No results found for: CHOLHDL No results found for: QIHK7Q    Assessment & Plan:  You do not have a yeast infection. Normal Pelvic examination. You do have a Urinary tract infection I have sent a prescription to your pharmacy, start and complete.  Drink plenty of water Follow up for non resolving or worsening sxs.   Problem List Items Addressed This Visit    None    Visit Diagnoses    Pelvic pressure in female    -  Primary   Relevant Orders   WET PREP FOR TRICH, YEAST, CLUE (Completed)   Vaginal discharge       Relevant Orders   WET PREP FOR TRICH, YEAST, CLUE (Completed)   UTI symptoms       Relevant Orders   Urinalysis, Routine w reflex microscopic   Urinary tract infection without hematuria, site unspecified       Relevant Medications   nitrofurantoin, macrocrystal-monohydrate, (MACROBID) 100 MG capsule       Meds ordered this encounter  Medications  . nitrofurantoin, macrocrystal-monohydrate, (MACROBID) 100 MG capsule    Sig: Take 1 capsule (100 mg total) by mouth 2 (two) times daily for 7 days.    Dispense:  14 capsule  Refill:  0    Follow-up: Return if symptoms worsen or fail to improve.    Annie Main, FNP

## 2020-05-12 NOTE — Patient Instructions (Addendum)
You do not have a yeast infection.  You do have a Urinary tract infection I have sent a prescription to your pharmacy, start and complete.  Drink plenty of water Follow up for non resolving or worsening sxs.

## 2020-06-22 DIAGNOSIS — F411 Generalized anxiety disorder: Secondary | ICD-10-CM | POA: Diagnosis not present

## 2020-06-22 DIAGNOSIS — F422 Mixed obsessional thoughts and acts: Secondary | ICD-10-CM | POA: Diagnosis not present

## 2020-06-22 DIAGNOSIS — N943 Premenstrual tension syndrome: Secondary | ICD-10-CM | POA: Diagnosis not present

## 2020-07-16 ENCOUNTER — Ambulatory Visit: Payer: Federal, State, Local not specified - PPO | Attending: Internal Medicine

## 2020-07-16 ENCOUNTER — Other Ambulatory Visit: Payer: Self-pay

## 2020-07-16 DIAGNOSIS — Z23 Encounter for immunization: Secondary | ICD-10-CM

## 2020-07-16 NOTE — Progress Notes (Signed)
   Covid-19 Vaccination Clinic  Name:  [name]    MRN: [id number] DOB: 1966-10-02  07/16/2020  [name] was observed post Covid-19 immunization for 15 minutes without incident. She was provided with Vaccine Information Sheet and instruction to access the V-Safe system.   [name] was instructed to call 911 with any severe reactions post vaccine: Marland Kitchen Difficulty breathing  . Swelling of face and throat  . A fast heartbeat  . A bad rash all over body  . Dizziness and weakness   Immunizations Administered    Name Date Dose VIS Date Route   JANSSEN COVID-19 VACCINE 07/16/2020  9:46 AM 0.5 mL 02/13/2020 Intramuscular   Manufacturer: [employer]   Lot: [id number]   NDC: 84665-993-57

## 2020-09-12 ENCOUNTER — Other Ambulatory Visit: Payer: Self-pay

## 2020-09-12 ENCOUNTER — Ambulatory Visit (INDEPENDENT_AMBULATORY_CARE_PROVIDER_SITE_OTHER): Payer: Federal, State, Local not specified - PPO | Admitting: Family Medicine

## 2020-09-12 VITALS — BP 120/90 | HR 79 | Temp 98.1°F | Ht 67.0 in | Wt 221.0 lb

## 2020-09-12 DIAGNOSIS — S8012XA Contusion of left lower leg, initial encounter: Secondary | ICD-10-CM

## 2020-09-12 DIAGNOSIS — S93401A Sprain of unspecified ligament of right ankle, initial encounter: Secondary | ICD-10-CM

## 2020-09-12 DIAGNOSIS — Z23 Encounter for immunization: Secondary | ICD-10-CM | POA: Diagnosis not present

## 2020-09-12 DIAGNOSIS — S8002XA Contusion of left knee, initial encounter: Secondary | ICD-10-CM | POA: Diagnosis not present

## 2020-09-12 NOTE — Progress Notes (Signed)
Subjective:    Patient ID: Bethany Hernandez, female    DOB: 1966-01-19, 54 y.o.   MRN: [id number]  HPI Patient is a very pleasant 54 year old Caucasian female who injured her left knee approximately a week ago.  She was at a Holiday representative site.  When she got out of her vehicle, her right ankle twisted on an uneven rock surface.  She suffered an inversion injury/ankle sprain causing her to lose her balance.  She fell directly on her flexed left knee striking just superior and medial to the patella.  Patient went to an outside clinic and had x-rays obtained.  Per her report there were no fractures identified on the x-ray.  She has significant bruising around the left knee.  There is yellowish-purple discoloration in the skin superior and medial to the patella up to the proximal quadricep.  There is yellowish-brownish bruising spreading down the medial surface of the shin all the way to below the lateral malleolus.  However fortunately this is very yellow and green in discoloration suggesting old blood.  There does not appear to be any new bleeding occurring or new bruising.  The skin is not erythematous or warm or hot to the touch.  There is no evidence of a cellulitis occurring in the hematoma.  Patient has full flexion and extension of her left knee without pain.  There is no tenderness to palpation along her left patella.  There is no laxity to varus or valgus stress surprisingly.  She has negative anterior drawer sign.  She has no pain with Apley grind.  She has a negative McMurray sign.  Surprisingly, there does not appear to be any internal damage to the knee although she does have a very large contusion and swelling in the soft tissues around the knee and spreading down the medial shin.  She denies any pain with ambulation.  She denies any instability.  She denies any locking.  She has some mild tenderness over her lateral malleolus however she is able to bear weight and she has full flexion and extension  of her ankle without pain Past Medical History:  Diagnosis Date  . Anxiety   . Depression   . Hemorrhoids    Past Surgical History:  Procedure Laterality Date  . BLADDER SURGERY    . SPINAL FUSION     Current Outpatient Medications on File Prior to Visit  Medication Sig Dispense Refill  . buPROPion (WELLBUTRIN XL) 300 MG 24 hr tablet Take 150 mg by mouth 2 (two) times daily. Takes 1/2 300 mg tablet BID    . fluvoxaMINE (LUVOX) 100 MG tablet Take 100 mg by mouth at bedtime.     No current facility-administered medications on file prior to visit.   Allergies  Allergen Reactions  . Sulfonamide Derivatives Hives and Other (See Comments)    All over the body   Social History   Socioeconomic History  . Marital status: Married    Spouse name: Not on file  . Number of children: 2  . Years of education: Not on file  . Highest education level: Not on file  Occupational History  . Occupation: Data processing manager: [employer]  Tobacco Use  . Smoking status: Never Smoker  . Smokeless tobacco: Never Used  Substance and Sexual Activity  . Alcohol use: No  . Drug use: No  . Sexual activity: Yes  Other Topics Concern  . Not on file  Social History Narrative  . Not on file  Social Determinants of Health   Financial Resource Strain:   . Difficulty of Paying Living Expenses: Not on file  Food Insecurity:   . Worried About Programme researcher, broadcasting/film/video in the Last Year: Not on file  . Ran Out of Food in the Last Year: Not on file  Transportation Needs:   . Lack of Transportation (Medical): Not on file  . Lack of Transportation (Non-Medical): Not on file  Physical Activity:   . Days of Exercise per Week: Not on file  . Minutes of Exercise per Session: Not on file  Stress:   . Feeling of Stress : Not on file  Social Connections:   . Frequency of Communication with Friends and Family: Not on file  . Frequency of Social Gatherings with Friends and Family: Not on file  . Attends Religious  Services: Not on file  . Active Member of Clubs or Organizations: Not on file  . Attends Banker Meetings: Not on file  . Marital Status: Not on file  Intimate Partner Violence:   . Fear of Current or Ex-Partner: Not on file  . Emotionally Abused: Not on file  . Physically Abused: Not on file  . Sexually Abused: Not on file      Review of Systems  All other systems reviewed and are negative.      Objective:   Physical Exam Vitals reviewed.  HENT:     Nose: Mucosal edema present.     Right Sinus: No maxillary sinus tenderness or frontal sinus tenderness.     Left Sinus: No maxillary sinus tenderness or frontal sinus tenderness.  Eyes:     Conjunctiva/sclera:     Right eye: Right conjunctiva is injected.     Left eye: Left conjunctiva is injected.  Cardiovascular:     Rate and Rhythm: Normal rate and regular rhythm.     Heart sounds: Normal heart sounds.  Pulmonary:     Effort: Pulmonary effort is normal. No respiratory distress.     Breath sounds: Normal breath sounds. No stridor. No wheezing.  Musculoskeletal:     Left knee: Swelling, effusion and ecchymosis present. No erythema or bony tenderness. Normal range of motion. No tenderness. No LCL laxity, MCL laxity, ACL laxity or PCL laxity.Normal alignment, normal meniscus and normal patellar mobility.     Instability Tests: Anterior drawer test negative. Posterior drawer test negative.     Right ankle: Swelling and ecchymosis present. No tenderness. Normal range of motion.       Legs:           Assessment & Plan:  Contusion of left knee and lower leg, initial encounter  Sprain of right ankle, unspecified ligament, initial encounter  I believe the patient has a mild sprain of her right ankle.  Despite significant bruising around the left knee and down the left shin, fortunately I believe that there is no internal derangement of the knee.  Her meniscal tests are normal and there is no sign of any  instability.  Furthermore she is not experiencing any pain.  Therefore I believe her injury is limited to superficial bruising only.  She will call me back if the bruising does not resorb or she develops pain or instability

## 2020-10-27 DIAGNOSIS — R7989 Other specified abnormal findings of blood chemistry: Secondary | ICD-10-CM | POA: Diagnosis not present

## 2020-10-27 DIAGNOSIS — Z01419 Encounter for gynecological examination (general) (routine) without abnormal findings: Secondary | ICD-10-CM | POA: Diagnosis not present

## 2020-10-27 DIAGNOSIS — Z1231 Encounter for screening mammogram for malignant neoplasm of breast: Secondary | ICD-10-CM | POA: Diagnosis not present

## 2020-11-25 DIAGNOSIS — R944 Abnormal results of kidney function studies: Secondary | ICD-10-CM | POA: Diagnosis not present

## 2020-11-25 DIAGNOSIS — N271 Small kidney, bilateral: Secondary | ICD-10-CM | POA: Diagnosis not present

## 2020-11-25 DIAGNOSIS — N2889 Other specified disorders of kidney and ureter: Secondary | ICD-10-CM | POA: Diagnosis not present

## 2020-12-20 DIAGNOSIS — F411 Generalized anxiety disorder: Secondary | ICD-10-CM | POA: Diagnosis not present

## 2020-12-20 DIAGNOSIS — N943 Premenstrual tension syndrome: Secondary | ICD-10-CM | POA: Diagnosis not present

## 2020-12-20 DIAGNOSIS — F422 Mixed obsessional thoughts and acts: Secondary | ICD-10-CM | POA: Diagnosis not present

## 2020-12-28 DIAGNOSIS — E669 Obesity, unspecified: Secondary | ICD-10-CM | POA: Diagnosis not present

## 2020-12-28 DIAGNOSIS — E785 Hyperlipidemia, unspecified: Secondary | ICD-10-CM | POA: Diagnosis not present

## 2020-12-28 DIAGNOSIS — N1831 Chronic kidney disease, stage 3a: Secondary | ICD-10-CM | POA: Diagnosis not present

## 2021-03-20 DIAGNOSIS — D1801 Hemangioma of skin and subcutaneous tissue: Secondary | ICD-10-CM | POA: Diagnosis not present

## 2021-03-20 DIAGNOSIS — L811 Chloasma: Secondary | ICD-10-CM | POA: Diagnosis not present

## 2021-03-20 DIAGNOSIS — D2362 Other benign neoplasm of skin of left upper limb, including shoulder: Secondary | ICD-10-CM | POA: Diagnosis not present

## 2021-03-20 DIAGNOSIS — L821 Other seborrheic keratosis: Secondary | ICD-10-CM | POA: Diagnosis not present

## 2021-05-09 DIAGNOSIS — F411 Generalized anxiety disorder: Secondary | ICD-10-CM | POA: Diagnosis not present

## 2021-05-09 DIAGNOSIS — N943 Premenstrual tension syndrome: Secondary | ICD-10-CM | POA: Diagnosis not present

## 2021-05-09 DIAGNOSIS — F422 Mixed obsessional thoughts and acts: Secondary | ICD-10-CM | POA: Diagnosis not present

## 2021-05-30 DIAGNOSIS — F422 Mixed obsessional thoughts and acts: Secondary | ICD-10-CM | POA: Diagnosis not present

## 2021-05-30 DIAGNOSIS — N943 Premenstrual tension syndrome: Secondary | ICD-10-CM | POA: Diagnosis not present

## 2021-05-30 DIAGNOSIS — F411 Generalized anxiety disorder: Secondary | ICD-10-CM | POA: Diagnosis not present

## 2021-06-29 DIAGNOSIS — F422 Mixed obsessional thoughts and acts: Secondary | ICD-10-CM | POA: Diagnosis not present

## 2021-06-29 DIAGNOSIS — N943 Premenstrual tension syndrome: Secondary | ICD-10-CM | POA: Diagnosis not present

## 2021-06-29 DIAGNOSIS — F411 Generalized anxiety disorder: Secondary | ICD-10-CM | POA: Diagnosis not present

## 2021-07-25 ENCOUNTER — Encounter: Payer: Self-pay | Admitting: Gastroenterology

## 2021-08-03 DIAGNOSIS — F411 Generalized anxiety disorder: Secondary | ICD-10-CM | POA: Diagnosis not present

## 2021-08-03 DIAGNOSIS — F422 Mixed obsessional thoughts and acts: Secondary | ICD-10-CM | POA: Diagnosis not present

## 2021-08-03 DIAGNOSIS — N943 Premenstrual tension syndrome: Secondary | ICD-10-CM | POA: Diagnosis not present

## 2021-08-17 DIAGNOSIS — N939 Abnormal uterine and vaginal bleeding, unspecified: Secondary | ICD-10-CM | POA: Diagnosis not present

## 2021-08-17 DIAGNOSIS — N950 Postmenopausal bleeding: Secondary | ICD-10-CM | POA: Diagnosis not present

## 2021-08-28 DIAGNOSIS — N939 Abnormal uterine and vaginal bleeding, unspecified: Secondary | ICD-10-CM | POA: Diagnosis not present

## 2021-09-06 DIAGNOSIS — R9389 Abnormal findings on diagnostic imaging of other specified body structures: Secondary | ICD-10-CM | POA: Diagnosis not present

## 2021-09-06 DIAGNOSIS — N950 Postmenopausal bleeding: Secondary | ICD-10-CM | POA: Diagnosis not present

## 2021-10-22 ENCOUNTER — Emergency Department (HOSPITAL_COMMUNITY): Payer: Federal, State, Local not specified - PPO

## 2021-10-22 ENCOUNTER — Emergency Department (HOSPITAL_COMMUNITY)
Admission: EM | Admit: 2021-10-22 | Discharge: 2021-10-22 | Disposition: A | Discharge status: Home / Self Care | Payer: Federal, State, Local not specified - PPO | Attending: Emergency Medicine | Admitting: Emergency Medicine

## 2021-10-22 DIAGNOSIS — R079 Chest pain, unspecified: Secondary | ICD-10-CM | POA: Diagnosis not present

## 2021-10-22 DIAGNOSIS — R509 Fever, unspecified: Secondary | ICD-10-CM | POA: Diagnosis not present

## 2021-10-22 DIAGNOSIS — J189 Pneumonia, unspecified organism: Secondary | ICD-10-CM | POA: Diagnosis not present

## 2021-10-22 DIAGNOSIS — N9489 Other specified conditions associated with female genital organs and menstrual cycle: Secondary | ICD-10-CM | POA: Diagnosis not present

## 2021-10-22 DIAGNOSIS — R000 Tachycardia, unspecified: Secondary | ICD-10-CM | POA: Diagnosis not present

## 2021-10-22 DIAGNOSIS — R0602 Shortness of breath: Secondary | ICD-10-CM | POA: Diagnosis not present

## 2021-10-22 DIAGNOSIS — J069 Acute upper respiratory infection, unspecified: Secondary | ICD-10-CM | POA: Diagnosis not present

## 2021-10-22 DIAGNOSIS — R531 Weakness: Secondary | ICD-10-CM | POA: Diagnosis not present

## 2021-10-22 DIAGNOSIS — R059 Cough, unspecified: Secondary | ICD-10-CM | POA: Diagnosis not present

## 2021-10-22 LAB — LACTIC ACID, PLASMA: Lactic Acid, Venous: 1.2 mmol/L (ref 0.5–1.9)

## 2021-10-22 LAB — I-STAT BETA HCG BLOOD, ED (MC, WL, AP ONLY): I-stat hCG, quantitative: 8.1 m[IU]/mL — ABNORMAL HIGH (ref <5)

## 2021-10-22 LAB — COMPREHENSIVE METABOLIC PANEL
ALT: 16 U/L (ref 0–44)
AST: 19 U/L (ref 15–41)
Albumin: 3.7 g/dL (ref 3.5–5.0)
Alkaline Phosphatase: 34 U/L — ABNORMAL LOW (ref 38–126)
Anion gap: 8 (ref 5–15)
BUN: 14 mg/dL (ref 6–20)
CO2: 24 mmol/L (ref 22–32)
Calcium: 8.5 mg/dL — ABNORMAL LOW (ref 8.9–10.3)
Chloride: 105 mmol/L (ref 98–111)
Creatinine, Ser: 1.23 mg/dL — ABNORMAL HIGH (ref 0.44–1.00)
GFR, Estimated: 53 mL/min — ABNORMAL LOW (ref >60)
Glucose, Bld: 106 mg/dL — ABNORMAL HIGH (ref 70–99)
Potassium: 3.7 mmol/L (ref 3.5–5.1)
Sodium: 137 mmol/L (ref 135–145)
Total Bilirubin: 0.4 mg/dL (ref 0.3–1.2)
Total Protein: 7 g/dL (ref 6.5–8.1)

## 2021-10-22 LAB — CBC WITH DIFFERENTIAL/PLATELET
Abs Immature Granulocytes: 0.13 10*3/uL — ABNORMAL HIGH (ref 0.00–0.07)
Basophils Absolute: 0 10*3/uL (ref 0.0–0.1)
Basophils Relative: 0 %
Eosinophils Absolute: 0.1 10*3/uL (ref 0.0–0.5)
Eosinophils Relative: 1 %
HCT: 43.8 % (ref 36.0–46.0)
Hemoglobin: 14.7 g/dL (ref 12.0–15.0)
Immature Granulocytes: 1 %
Lymphocytes Relative: 6 %
Lymphs Abs: 0.7 10*3/uL (ref 0.7–4.0)
MCH: 29.9 pg (ref 26.0–34.0)
MCHC: 33.6 g/dL (ref 30.0–36.0)
MCV: 89.2 fL (ref 80.0–100.0)
Monocytes Absolute: 0.6 10*3/uL (ref 0.1–1.0)
Monocytes Relative: 5 %
Neutro Abs: 9.7 10*3/uL — ABNORMAL HIGH (ref 1.7–7.7)
Neutrophils Relative %: 87 %
Platelets: 190 10*3/uL (ref 150–400)
RBC: 4.91 MIL/uL (ref 3.87–5.11)
RDW: 13 % (ref 11.5–15.5)
WBC: 11.2 10*3/uL — ABNORMAL HIGH (ref 4.0–10.5)
nRBC: 0 % (ref 0.0–0.2)

## 2021-10-22 LAB — BRAIN NATRIURETIC PEPTIDE: B Natriuretic Peptide: 104.1 pg/mL — ABNORMAL HIGH (ref 0.0–100.0)

## 2021-10-22 MED ORDER — AMOXICILLIN 500 MG PO CAPS: 1000.0000 mg | ORAL_CAPSULE | Freq: Three times a day (TID) | ORAL | 0 refills | Status: AC

## 2021-10-22 MED ORDER — SODIUM CHLORIDE 0.9 % IV SOLN
1.0000 g | Freq: Once | INTRAVENOUS | Status: AC
  Administered 2021-10-22: 1 g via INTRAVENOUS
  Filled 2021-10-22: qty 10

## 2021-10-22 MED ORDER — AZITHROMYCIN 250 MG PO TABS
500.0000 mg | ORAL_TABLET | Freq: Once | ORAL | Status: AC
  Administered 2021-10-22: 500 mg via ORAL
  Filled 2021-10-22: qty 2

## 2021-10-22 MED ORDER — ACETAMINOPHEN 500 MG PO TABS
1000.0000 mg | ORAL_TABLET | Freq: Once | ORAL | Status: AC
  Administered 2021-10-22: 1000 mg via ORAL
  Filled 2021-10-22: qty 2

## 2021-10-22 MED ORDER — LACTATED RINGERS IV BOLUS
1000.0000 mL | Freq: Once | INTRAVENOUS | Status: AC
  Administered 2021-10-22: 1000 mL via INTRAVENOUS

## 2021-10-22 MED ORDER — METOCLOPRAMIDE HCL 5 MG/ML IJ SOLN
10.0000 mg | Freq: Once | INTRAMUSCULAR | Status: DC
  Filled 2021-10-22: qty 2

## 2021-10-22 MED ORDER — LACTATED RINGERS IV BOLUS: 1000.0000 mL | Freq: Once | INTRAVENOUS | Status: DC

## 2021-10-22 MED ORDER — SODIUM CHLORIDE 0.9 % IV SOLN: 500.0000 mg | Freq: Once | INTRAVENOUS | Status: DC

## 2021-10-22 MED ORDER — DIPHENHYDRAMINE HCL 25 MG PO CAPS
25.0000 mg | ORAL_CAPSULE | Freq: Once | ORAL | Status: DC
  Filled 2021-10-22: qty 1

## 2021-10-22 MED ORDER — ONDANSETRON HCL 4 MG/2ML IJ SOLN
4.0000 mg | Freq: Once | INTRAMUSCULAR | Status: AC
  Administered 2021-10-22: 4 mg via INTRAVENOUS
  Filled 2021-10-22: qty 2

## 2021-10-22 NOTE — ED Notes (Signed)
Patient unable to answer questions or keep eyes open In triage, mumbles when asked questions - loc decreased , therefore patient will be priority in rooming at this time.

## 2021-10-22 NOTE — Discharge Instructions (Addendum)
You were seen in the Emergency Department and likely have Community Aquired Pneumonia. I have sent antibiotics in to your pharmacy. Please return if you develop worsening symptoms.   Please take all of your antibiotics until finished!   You may develop abdominal discomfort or diarrhea from the antibiotic.  You may help offset this with probiotics which you can buy or get in yogurt. Do not eat  or take the probiotics until 2 hours after your antibiotic.

## 2021-10-22 NOTE — ED Provider Notes (Signed)
[facility]-EMERGENCY DEPT Provider Note   CSN: [id number] Arrival date & time: 10/22/21  1216     History Chief Complaint  Patient presents with   Fever   Cough    Bethany Hernandez is a 55 y.o. female.  HPI Patient is a 55 year old female with past medical history significant for anxiety depression and hemorrhoids  She is presented to the ER today with complaints of of cough, fatigue, fever, fatigue myalgias headache her symptoms initially started 2 weeks ago and she felt ill for 1 week.  After this her symptoms seem to improved however 2 or 3 days ago she began feeling ill with similar symptoms of cough fatigue fevers myalgias headache and her symptoms progressing to the point where her husband called EMS because she was refusing to move and seemed ill.  EMS found patient to be with low SPO2 of 91% initially and brought patient to emergency room on oxygen.  On arrival patient initially somewhat lethargic appearing however with a sternal rub she immediately responds and seems quite alert and reproach well of discomfort.  She is complaining primarily of cough fever aches and congestion as well as headache  She denies any chest pain and states she does not feel particularly short of breath.  She states she does feel somewhat nauseous but has not had any emesis.  Denies any abdominal pain back or neck pain.  Denies any numbness or weakness in the extremity.  Has not had any seizure-like activity or episodes of syncope or near syncope.  Her whole family has been having similar symptoms however not as severe as she.     Past Medical History:  Diagnosis Date   Anxiety    Depression    Hemorrhoids     Patient Active Problem List   Diagnosis Date Noted   Family history of colon cancer 04/25/2016   Calculus of gallbladder and bile duct without cholecystitis or obstruction 04/25/2016    Past Surgical History:  Procedure Laterality Date   BLADDER SURGERY      SPINAL FUSION       OB History   No obstetric history on file.     Family History  Problem Relation Age of Onset   Rectal cancer Mother    COPD Father     Social History   Tobacco Use   Smoking status: Never   Smokeless tobacco: Never  Substance Use Topics   Alcohol use: No   Drug use: No    Home Medications Prior to Admission medications   Medication Sig Start Date End Date Taking? Authorizing Provider  buPROPion (WELLBUTRIN XL) 300 MG 24 hr tablet Take 150 mg by mouth 2 (two) times daily. Takes 1/2 300 mg tablet BID 05/20/16  Yes [provider]  DM-Doxylamine-Acetaminophen (NYQUIL HBP COLD & FLU) 15-6.25-325 MG/15ML LIQD Take 15 mLs by mouth every 6 (six) hours as needed (flu symptoms).   Yes [provider]  fluvoxaMINE (LUVOX) 100 MG tablet Take 100 mg by mouth at bedtime.   Yes [provider]    Allergies    Sulfa antibiotics and Sulfonamide derivatives  Review of Systems   Review of Systems  Constitutional:  Positive for chills, fatigue and fever.  HENT:  Positive for congestion, postnasal drip, rhinorrhea and sore throat.   Eyes:  Negative for redness.  Respiratory:  Positive for cough. Negative for shortness of breath.   Cardiovascular:  Negative for chest pain and leg swelling.  Gastrointestinal:  Positive  for nausea. Negative for abdominal pain, diarrhea and vomiting.  Endocrine: Negative for polyphagia.  Genitourinary:  Negative for dysuria.  Musculoskeletal:  Positive for myalgias.  Skin:  Negative for rash.  Neurological:  Positive for headaches. Negative for syncope.  Psychiatric/Behavioral:  Negative for confusion.    Physical Exam Updated Vital Signs BP (!) 113/57   Pulse 94   Temp (!) 102.7 F (39.3 C) (Oral)   Resp 19   Ht 5\' 7"  (1.702 m)   Wt 101.2 kg   SpO2 92%   BMI 34.93 kg/m   Physical Exam Vitals and nursing note reviewed.  Constitutional:      General: She is not in acute distress.    Comments:  Sleepy but easily arousable 55 year old female appears uncomfortable but nontoxic speaking in full sentences  HENT:     Head: Normocephalic and atraumatic.     Nose: Nose normal.  Eyes:     General: No scleral icterus. Cardiovascular:     Rate and Rhythm: Regular rhythm. Tachycardia present.     Pulses: Normal pulses.     Heart sounds: Normal heart sounds.     Comments: Heart rate of 114 Regular Pulmonary:     Effort: Pulmonary effort is normal. No respiratory distress.     Breath sounds: No wheezing.     Comments: Coarse lung sounds some faint crackles in right base. Abdominal:     Palpations: Abdomen is soft.     Tenderness: There is no abdominal tenderness. There is no guarding or rebound.  Musculoskeletal:     Cervical back: Normal range of motion.     Right lower leg: No edema.     Left lower leg: No edema.     Comments: No lower extremity edema no calf tenderness.  Skin:    General: Skin is warm and dry.     Capillary Refill: Capillary refill takes less than 2 seconds.  Neurological:     Mental Status: She is alert. Mental status is at baseline.  Psychiatric:        Mood and Affect: Mood normal.        Behavior: Behavior normal.    ED Results / Procedures / Treatments   Labs (all labs ordered are listed, but only abnormal results are displayed) Labs Reviewed  COMPREHENSIVE METABOLIC PANEL - Abnormal; Notable for the following components:      Result Value   Glucose, Bld 106 (*)    Creatinine, Ser 1.23 (*)    Calcium 8.5 (*)    Alkaline Phosphatase 34 (*)    GFR, Estimated 53 (*)    All other components within normal limits  BRAIN NATRIURETIC PEPTIDE - Abnormal; Notable for the following components:   B Natriuretic Peptide 104.1 (*)    All other components within normal limits  CBC WITH DIFFERENTIAL/PLATELET - Abnormal; Notable for the following components:   WBC 11.2 (*)    Neutro Abs 9.7 (*)    Abs Immature Granulocytes 0.13 (*)    All other components  within normal limits  I-STAT BETA HCG BLOOD, ED ([facility], [facility], [facility] ONLY) - Abnormal; Notable for the following components:   I-stat hCG, quantitative 8.1 (*)    All other components within normal limits  CULTURE, BLOOD (ROUTINE X 2)  CULTURE, BLOOD (ROUTINE X 2)  RESP PANEL BY RT-PCR (FLU A&B, COVID) ARPGX2  LACTIC ACID, PLASMA  LACTIC ACID, PLASMA  CBC WITH DIFFERENTIAL/PLATELET    EKG None  Radiology DG Chest Portable 1 View  Result Date: 10/22/2021 CLINICAL DATA:  Fever, cough, chest pain.  Nonsmoker. EXAM: PORTABLE CHEST 1 VIEW COMPARISON:  None. FINDINGS: Subtle interstitial opacities in the lower lobes, left greater than right. No lobar consolidation. No pleural effusion or pneumothorax. Heart is normal in size. Visualized osseous structures are unremarkable. IMPRESSION: Subtle bibasilar interstitial opacities may represent infectious process, including viral etiologies. No lobar consolidation or pleural effusion. Electronically Signed   By: Sherron Ales M.D.   On: 10/22/2021 14:29    Procedures Procedures   Medications Ordered in ED Medications  metoCLOPramide (REGLAN) injection 10 mg (has no administration in time range)  diphenhydrAMINE (BENADRYL) capsule 25 mg (has no administration in time range)  acetaminophen (TYLENOL) tablet 1,000 mg (1,000 mg Oral Given 10/22/21 1408)  lactated ringers bolus 1,000 mL (1,000 mLs Intravenous New Bag/Given 10/22/21 1409)  ondansetron (ZOFRAN) injection 4 mg (4 mg Intravenous Given 10/22/21 1409)  cefTRIAXone (ROCEPHIN) 1 g in sodium chloride 0.9 % 100 mL IVPB (1 g Intravenous New Bag/Given 10/22/21 1409)    ED Course  I have reviewed the triage vital signs and the nursing notes.  Pertinent labs & imaging results that were available during my care of the patient were reviewed by me and considered in my medical decision making (see chart for details).  Clinical Course as of 10/22/21 1607  Sun Oct 22, 2021  1457 Pulse Rate: 99 [WF]  1527  Fluids, abx x 1 dose, pending respiratory. IF respiratory panel negative, possible discharge on abx, if positive, send home. Walk prior to check O2. [GL]    Clinical Course User Index [GL] Loeffler, Finis Bud, PA-C [WF] Gailen Shelter, [place]   MDM Rules/Calculators/A&P                          Patient is a 55 year old female presented today with symptoms consistent with a viral URI she was tachycardic and febrile on presentation and did seem somewhat fatigued/lethargic and received extensive work-up with blood cultures and lactic acid however her symptoms , tachycardia and fever have significantly improved with 1 dose of Tylenol and 1 L of fluids.  Her lactic acid is within normal limits she received initial bolus with improvement in her tachycardia CBC with mild leukocytosis she does appear dehydrated and this may be hemoconcentration versus due to a post viral pneumonia given her second sickening.  Antibiotics empirically given Rocephin will also provide with a dose of azithromycin.  X-ray consistent with viral pneumonia  No obvious infiltrate CBC reviewed above CMP unremarkable creatinine not significantly dissimilar from prior labs Lactic acid within normal limits i-STAT hCG incompatible with pregnancy.  BNP marginally elevated but not consistent with CHF and she has no history of similar.  Blood cultures pending COVID influenza pending.  EKG nonischemic with mild sinus tachycardia.  Patient care handed off to [employer].  She will follow-up on patient and reassess anticipate discharge home with antiemetic and antibiotics versus conservative viral URI treatment.   CISSY SASSEEN was evaluated in Emergency Department on 10/22/2021 for the symptoms described in the history of present illness. She was evaluated in the context of the global COVID-19 pandemic, which necessitated consideration that the patient might be at risk for infection with the SARS-CoV-2 virus that causes COVID-19.  Institutional protocols and algorithms that pertain to the evaluation of patients at risk for COVID-19 are in a state of rapid change based on information released by regulatory bodies including the CDC  and federal and state organizations. These policies and algorithms were followed during the patient's care in the ED.   Final Clinical Impression(s) / ED Diagnoses Final diagnoses:  Fever, unspecified fever cause    Rx / DC Orders ED Discharge Orders     None        Gailen Shelter, [place] 10/22/21 1611    Lorre Nick, MD 10/24/21 1555

## 2021-10-22 NOTE — ED Notes (Signed)
Pt NAD, a/ox4. Pt verbalizes understanding of all DC and f/u instructions. All questions answered. Pt walks with steady gait to lobby at DC with spouse.   

## 2021-10-22 NOTE — ED Provider Notes (Signed)
Accepted handoff at shift change from [place], [place]. Please see prior provider note for more detail.   Briefly: Patient is 55 y.o. She is presented to the ER today with complaints of of cough, fatigue, fever, fatigue myalgias headache her symptoms initially started 2 weeks ago and she felt ill for 1 week.  After this her symptoms seem to improved however 2 or 3 days ago she began feeling ill with similar symptoms of cough fatigue fevers myalgias headache and her symptoms progressing to the point where her husband called EMS because she was refusing to move and seemed ill.  EMS found patient to be with low SPO2 of 91% initially and brought patient to emergency room on oxygen.  On arrival patient initially somewhat lethargic appearing however with a sternal rub she immediately responds and seems quite alert and reproach well of discomfort.  She is complaining primarily of cough fever aches and congestion as well as headache. She denies any chest pain and states she does not feel particularly short of breath.  She states she does feel somewhat nauseous but has not had any emesis.  Denies any abdominal pain back or neck pain.  Denies any numbness or weakness in the extremity.  Has not had any seizure-like activity or episodes of syncope or near syncope.   Her whole family has been having similar symptoms however not as severe as she.    DDX: concern for COVID PNA, CAP  Plan: Respiratory panel pending. Likely discharge on antibiotics depending on results. Needs to ambulate without being hypoxic prior to discharge.    Physical Exam  BP (!) 101/56   Pulse 81   Temp (!) 102.7 F (39.3 C) (Oral)   Resp (!) 21   Ht 5\' 7"  (1.702 m)   Wt 101.2 kg   SpO2 92%   BMI 34.93 kg/m   Physical Exam Vitals and nursing note reviewed.  Constitutional:      General: She is not in acute distress.    Appearance: Normal appearance. She is well-developed. She is not ill-appearing, toxic-appearing or diaphoretic.   HENT:     Head: Normocephalic and atraumatic.     Nose: No nasal deformity.     Mouth/Throat:     Lips: Pink. No lesions.  Eyes:     General: Gaze aligned appropriately. No scleral icterus.       Right eye: No discharge.        Left eye: No discharge.     Conjunctiva/sclera: Conjunctivae normal.     Right eye: Right conjunctiva is not injected. No exudate or hemorrhage.    Left eye: Left conjunctiva is not injected. No exudate or hemorrhage. Cardiovascular:     Heart sounds: Normal heart sounds. No murmur heard.   No friction rub. No gallop.  Pulmonary:     Effort: Pulmonary effort is normal. No respiratory distress.     Breath sounds: Normal breath sounds. No stridor. No wheezing, rhonchi or rales.  Chest:     Chest wall: No tenderness.  Skin:    General: Skin is warm and dry.  Neurological:     Mental Status: She is alert and oriented to person, place, and time.  Psychiatric:        Mood and Affect: Mood normal.        Speech: Speech normal.        Behavior: Behavior normal. Behavior is cooperative.    ED Course/Procedures   Clinical Course as of 10/22/21 1903  13/06/22 Oct 22, 2021  1457 Pulse Rate: 99 [WF]  1527 Fluids, abx x 1 dose, pending respiratory. IF respiratory panel negative, possible discharge on abx, if positive, send home. Walk prior to check O2. [GL]    Clinical Course User Index [GL] Jelene Albano, Finis Bud, PA-C [WF] Gailen Shelter, [place]    Procedures  MDM  This is a patient that I received from signout from the previous shift.  All work-up had already been done.  Plan was follow-up on respiratory panel and ambulate patient to assess for hypoxia.  Discharge home on antibiotics.  I assessed patient, and she appears to be in no acute distress.  States that she feels so much better since she has been here.  She was given last dose of azithromycin.  She was ambulated with no signs of hypoxia.  Vitals remained stable.  Respiratory panel still pending, however given  chest x-ray findings that were concerning for focal pneumonia, will discharge home on antibiotics regardless of results.       Claudie Leach, PA-C 10/22/21 1903    Rozelle Logan, DO 10/22/21 2248

## 2021-10-22 NOTE — ED Triage Notes (Addendum)
Patient bib [facility], patients has cough, fever, bilateral RALES x 1 days. Patient o2 sat with EMS was 91% on RA, EMS applied 2L o2, o2 sat 97% with 2L. Patient very lethargic, sweaty in triage. Unable to transfer without assistance due to weakness. Productive cough.

## 2021-10-26 ENCOUNTER — Ambulatory Visit: Payer: Federal, State, Local not specified - PPO

## 2021-10-26 DIAGNOSIS — F422 Mixed obsessional thoughts and acts: Secondary | ICD-10-CM | POA: Diagnosis not present

## 2021-10-26 DIAGNOSIS — F411 Generalized anxiety disorder: Secondary | ICD-10-CM | POA: Diagnosis not present

## 2021-10-26 DIAGNOSIS — N943 Premenstrual tension syndrome: Secondary | ICD-10-CM | POA: Diagnosis not present

## 2021-10-27 LAB — CULTURE, BLOOD (ROUTINE X 2)
Culture: NO GROWTH
Culture: NO GROWTH

## 2021-11-02 ENCOUNTER — Ambulatory Visit: Payer: Federal, State, Local not specified - PPO | Admitting: Family Medicine

## 2021-11-02 ENCOUNTER — Other Ambulatory Visit: Payer: Self-pay

## 2021-11-02 ENCOUNTER — Encounter: Payer: Self-pay | Admitting: Family Medicine

## 2021-11-02 VITALS — BP 128/82 | HR 80 | Temp 97.8°F | Resp 18 | Ht 67.0 in | Wt 219.0 lb

## 2021-11-02 DIAGNOSIS — J45901 Unspecified asthma with (acute) exacerbation: Secondary | ICD-10-CM | POA: Diagnosis not present

## 2021-11-02 MED ORDER — ALBUTEROL SULFATE HFA 108 (90 BASE) MCG/ACT IN AERS: 2.0000 | INHALATION_SPRAY | Freq: Four times a day (QID) | RESPIRATORY_TRACT | 0 refills | Status: DC | PRN

## 2021-11-02 MED ORDER — PREDNISONE 20 MG PO TABS: ORAL_TABLET | ORAL | 0 refills | Status: DC

## 2021-11-02 NOTE — Progress Notes (Signed)
Subjective:    Patient ID: Bethany Hernandez, female    DOB: [birth year], 55 y.o.   MRN: [id number]  HPI Patient's entire family has been sick recently with some type of virus.  She went to the emergency room November 6 with shortness of breath and coughing.  Labs showed mildly elevated white blood cell count of 11.  Chest x-ray shows subtle bibasilar opacities consistent with atypical infection versus viral pneumonia.  She was started on amoxicillin.  She states she is doing slightly better.  However today on examination she has diffuse expiratory wheezing and rhonchorous breath sounds.  There are no crackles or rails appreciated.  She denies any fevers or chills or pleurisy or hemoptysis or purulent sputum.Marland Kitchen Past Medical History:  Diagnosis Date   Anxiety    Depression    Hemorrhoids    Past Surgical History:  Procedure Laterality Date   BLADDER SURGERY     SPINAL FUSION     Current Outpatient Medications on File Prior to Visit  Medication Sig Dispense Refill   buPROPion (WELLBUTRIN XL) 300 MG 24 hr tablet Take 150 mg by mouth 2 (two) times daily. Takes 1/2 300 mg tablet BID     fluvoxaMINE (LUVOX) 100 MG tablet Take 100 mg by mouth at bedtime.     No current facility-administered medications on file prior to visit.   Marland Kitchenall Social History   Socioeconomic History   Marital status: Married    Spouse name: Not on file   Number of children: 2   Years of education: Not on file   Highest education level: Not on file  Occupational History   Occupation: Data processing manager: [employer]  Tobacco Use   Smoking status: Never   Smokeless tobacco: Never  Substance and Sexual Activity   Alcohol use: No   Drug use: No   Sexual activity: Yes  Other Topics Concern   Not on file  Social History Narrative   Not on file   Social Determinants of Health   Financial Resource Strain: Not on file  Food Insecurity: Not on file  Transportation Needs: Not on file  Physical Activity: Not on file   Stress: Not on file  Social Connections: Not on file  Intimate Partner Violence: Not on file      Review of Systems  All other systems reviewed and are negative.     Objective:   Physical Exam Constitutional:      Appearance: Normal appearance. She is normal weight.  HENT:     Right Ear: Tympanic membrane and ear canal normal.     Left Ear: Tympanic membrane and ear canal normal.     Mouth/Throat:     Mouth: Mucous membranes are moist.     Pharynx: Oropharynx is clear. No oropharyngeal exudate or posterior oropharyngeal erythema.  Cardiovascular:     Rate and Rhythm: Normal rate and regular rhythm.     Heart sounds: Normal heart sounds.  Pulmonary:     Breath sounds: Wheezing and rhonchi present.  Musculoskeletal:     Cervical back: Neck supple.  Neurological:     Mental Status: She is alert.          Assessment & Plan:  Reactive airway disease with wheezing with acute exacerbation, unspecified asthma severity, unspecified whether persistent Suspected viral pneumonia possibly even COVID versus RSV but I believe she is having bronchospasms and wheezing due to reactive airway disease.  Begin a prednisone taper pack coupled with albuterol  2 puffs inhaled every 4-6 hours as needed and recheck next week if no better or sooner if worsening

## 2021-11-25 ENCOUNTER — Other Ambulatory Visit: Payer: Self-pay | Admitting: Family Medicine

## 2021-11-30 DIAGNOSIS — H524 Presbyopia: Secondary | ICD-10-CM | POA: Diagnosis not present

## 2021-11-30 DIAGNOSIS — H5203 Hypermetropia, bilateral: Secondary | ICD-10-CM | POA: Diagnosis not present

## 2021-11-30 DIAGNOSIS — H04123 Dry eye syndrome of bilateral lacrimal glands: Secondary | ICD-10-CM | POA: Diagnosis not present

## 2022-01-02 DIAGNOSIS — F411 Generalized anxiety disorder: Secondary | ICD-10-CM | POA: Diagnosis not present

## 2022-01-02 DIAGNOSIS — F422 Mixed obsessional thoughts and acts: Secondary | ICD-10-CM | POA: Diagnosis not present

## 2022-01-02 DIAGNOSIS — N943 Premenstrual tension syndrome: Secondary | ICD-10-CM | POA: Diagnosis not present

## 2022-01-09 DIAGNOSIS — Z1231 Encounter for screening mammogram for malignant neoplasm of breast: Secondary | ICD-10-CM | POA: Diagnosis not present

## 2022-01-09 DIAGNOSIS — Z6835 Body mass index (BMI) 35.0-35.9, adult: Secondary | ICD-10-CM | POA: Diagnosis not present

## 2022-01-09 DIAGNOSIS — Z01419 Encounter for gynecological examination (general) (routine) without abnormal findings: Secondary | ICD-10-CM | POA: Diagnosis not present

## 2022-04-10 DIAGNOSIS — F422 Mixed obsessional thoughts and acts: Secondary | ICD-10-CM | POA: Diagnosis not present

## 2022-04-10 DIAGNOSIS — N943 Premenstrual tension syndrome: Secondary | ICD-10-CM | POA: Diagnosis not present

## 2022-04-10 DIAGNOSIS — F411 Generalized anxiety disorder: Secondary | ICD-10-CM | POA: Diagnosis not present

## 2022-06-04 ENCOUNTER — Ambulatory Visit: Payer: Federal, State, Local not specified - PPO | Admitting: Family Medicine

## 2022-09-20 DIAGNOSIS — F411 Generalized anxiety disorder: Secondary | ICD-10-CM | POA: Diagnosis not present

## 2022-09-20 DIAGNOSIS — F422 Mixed obsessional thoughts and acts: Secondary | ICD-10-CM | POA: Diagnosis not present

## 2022-09-20 DIAGNOSIS — N943 Premenstrual tension syndrome: Secondary | ICD-10-CM | POA: Diagnosis not present

## 2023-01-10 DIAGNOSIS — F411 Generalized anxiety disorder: Secondary | ICD-10-CM | POA: Diagnosis not present

## 2023-01-10 DIAGNOSIS — F422 Mixed obsessional thoughts and acts: Secondary | ICD-10-CM | POA: Diagnosis not present

## 2023-01-10 DIAGNOSIS — N943 Premenstrual tension syndrome: Secondary | ICD-10-CM | POA: Diagnosis not present

## 2023-01-15 DIAGNOSIS — Z6837 Body mass index (BMI) 37.0-37.9, adult: Secondary | ICD-10-CM | POA: Diagnosis not present

## 2023-01-15 DIAGNOSIS — Z124 Encounter for screening for malignant neoplasm of cervix: Secondary | ICD-10-CM | POA: Diagnosis not present

## 2023-01-15 DIAGNOSIS — Z01419 Encounter for gynecological examination (general) (routine) without abnormal findings: Secondary | ICD-10-CM | POA: Diagnosis not present

## 2023-01-15 DIAGNOSIS — Z1231 Encounter for screening mammogram for malignant neoplasm of breast: Secondary | ICD-10-CM | POA: Diagnosis not present

## 2023-01-15 DIAGNOSIS — Z13820 Encounter for screening for osteoporosis: Secondary | ICD-10-CM | POA: Diagnosis not present

## 2023-03-28 ENCOUNTER — Ambulatory Visit: Payer: Federal, State, Local not specified - PPO | Admitting: Family Medicine

## 2023-03-28 ENCOUNTER — Encounter: Payer: Self-pay | Admitting: Family Medicine

## 2023-03-28 VITALS — BP 126/72 | HR 79 | Temp 97.9°F | Ht 67.0 in | Wt 229.0 lb

## 2023-03-28 DIAGNOSIS — M79671 Pain in right foot: Secondary | ICD-10-CM

## 2023-03-28 DIAGNOSIS — F418 Other specified anxiety disorders: Secondary | ICD-10-CM | POA: Insufficient documentation

## 2023-03-28 DIAGNOSIS — R87619 Unspecified abnormal cytological findings in specimens from cervix uteri: Secondary | ICD-10-CM | POA: Insufficient documentation

## 2023-03-28 NOTE — Progress Notes (Signed)
Subjective:    Patient ID: Bethany Hernandez, female    DOB: 01-06-1966, 57 y.o.   MRN: [id number]  HPI Patient reports a painful knot near the insertion of the Achilles tendon on the back of her right calcaneus.  Is tender to palpation in that area.  It hurts when she walks.  The Achilles tendon is intact.  She has negative Thompson's test.  There is no tenderness to palpation along the body of the gastrocnemius or the Achilles tendon however it is tender near the insertion of the posterior right calcaneus.  There is no erythema or warmth.  She states that it has been irritated and painful off and on for over a year. Past Medical History:  Diagnosis Date   Anxiety    Depression    Hemorrhoids    Past Surgical History:  Procedure Laterality Date   BLADDER SURGERY     SPINAL FUSION     Current Outpatient Medications on File Prior to Visit  Medication Sig Dispense Refill   albuterol (VENTOLIN HFA) 108 (90 Base) MCG/ACT inhaler TAKE 2 PUFFS BY MOUTH EVERY 6 HOURS AS NEEDED FOR WHEEZE OR SHORTNESS OF BREATH 8.5 each 0   buPROPion (WELLBUTRIN XL) 300 MG 24 hr tablet Take 150 mg by mouth 2 (two) times daily. Takes 1/2 300 mg tablet BID     fluvoxaMINE (LUVOX) 100 MG tablet Take 100 mg by mouth at bedtime.     predniSONE (DELTASONE) 20 MG tablet 3 tabs poqday 1-2, 2 tabs poqday 3-4, 1 tab poqday 5-6 12 tablet 0   No current facility-administered medications on file prior to visit.   Marland Kitchenall Social History   Socioeconomic History   Marital status: Married    Spouse name: Not on file   Number of children: 2   Years of education: Not on file   Highest education level: Not on file  Occupational History   Occupation: Data processing manager: [employer]  Tobacco Use   Smoking status: Never   Smokeless tobacco: Never  Substance and Sexual Activity   Alcohol use: No   Drug use: No   Sexual activity: Yes  Other Topics Concern   Not on file  Social History Narrative   Not on file   Social  Determinants of Health   Financial Resource Strain: Not on file  Food Insecurity: Not on file  Transportation Needs: Not on file  Physical Activity: Not on file  Stress: Not on file  Social Connections: Not on file  Intimate Partner Violence: Not on file      Review of Systems  All other systems reviewed and are negative.      Objective:   Physical Exam Constitutional:      Appearance: Normal appearance. She is normal weight.  Cardiovascular:     Rate and Rhythm: Normal rate and regular rhythm.     Heart sounds: Normal heart sounds.  Pulmonary:     Effort: Pulmonary effort is normal. No respiratory distress.     Breath sounds: Normal breath sounds. No wheezing or rhonchi.  Musculoskeletal:     Cervical back: Neck supple.     Right foot: Normal range of motion. Deformity, tenderness and bony tenderness present. No swelling or crepitus.       Feet:  Neurological:     Mental Status: She is alert.           Assessment & Plan:  Pain of right heel - Plan: DG Foot Complete  Right I believe the patient has chronic calcific Achilles tendinitis/tendinopathy versus calcaneal bursitis.  I recommended heel lifts for  both of her shoes and also meloxicam 15 mg daily.  Obtain x-rays of the right heel to evaluate for any pathologic findings.  Recheck in 2 to 3 weeks if no better.

## 2023-04-10 DIAGNOSIS — F422 Mixed obsessional thoughts and acts: Secondary | ICD-10-CM | POA: Diagnosis not present

## 2023-04-10 DIAGNOSIS — F411 Generalized anxiety disorder: Secondary | ICD-10-CM | POA: Diagnosis not present

## 2023-04-10 DIAGNOSIS — N943 Premenstrual tension syndrome: Secondary | ICD-10-CM | POA: Diagnosis not present

## 2023-07-17 DIAGNOSIS — F422 Mixed obsessional thoughts and acts: Secondary | ICD-10-CM | POA: Diagnosis not present

## 2023-07-17 DIAGNOSIS — N943 Premenstrual tension syndrome: Secondary | ICD-10-CM | POA: Diagnosis not present

## 2023-07-17 DIAGNOSIS — F411 Generalized anxiety disorder: Secondary | ICD-10-CM | POA: Diagnosis not present

## 2023-08-30 ENCOUNTER — Ambulatory Visit: Payer: Self-pay

## 2023-08-30 NOTE — Telephone Encounter (Signed)
     Chief Complaint: Dizziness started last night. Fell at home and hit her face. Has to hold on to walls, furniture when walking around. Symptoms: Above Frequency: Last night Pertinent Negatives: Patient denies  Disposition: [] ED /[x] Urgent Care (no appt availability in office) / [] Appointment(In office/virtual)/ []  [facility]/ [] Home Care/ [] Refused Recommended Disposition /[] [facility]/ []  Follow-up with PCP Additional Notes: Pt. Agrees with UC.  Reason for Disposition  [1] MODERATE dizziness (e.g., interferes with normal activities) AND [2] has NOT been evaluated by doctor (or NP/PA) for this  (Exception: Dizziness caused by heat exposure, sudden standing, or poor fluid intake.)  Answer Assessment - Initial Assessment Questions 1. DESCRIPTION: "Describe your dizziness."     Dizzy 2. LIGHTHEADED: "Do you feel lightheaded?" (e.g., somewhat faint, woozy, weak upon standing)     Woozy 3. VERTIGO: "Do you feel like either you or the room is spinning or tilting?" (i.e. vertigo)     No 4. SEVERITY: "How bad is it?"  "Do you feel like you are going to faint?" "Can you stand and walk?"   - MILD: Feels slightly dizzy, but walking normally.   - MODERATE: Feels unsteady when walking, but not falling; interferes with normal activities (e.g., school, work).   - SEVERE: Unable to walk without falling, or requires assistance to walk without falling; feels like passing out now.      Moderate 5. ONSET:  "When did the dizziness begin?"     Last night 6. AGGRAVATING FACTORS: "Does anything make it worse?" (e.g., standing, change in head position)     Waking 7. HEART RATE: "Can you tell me your heart rate?" "How many beats in 15 seconds?"  (Note: not all patients can do this)       No 8. CAUSE: "What do you think is causing the dizziness?"     Unsure 9. RECURRENT SYMPTOM: "Have you had dizziness before?" If Yes, ask: "When was the last time?" "What happened that time?"      Yes 10. OTHER SYMPTOMS: "Do you have any other symptoms?" (e.g., fever, chest pain, vomiting, diarrhea, bleeding)       Mild headache 11. PREGNANCY: "Is there any chance you are pregnant?" "When was your last menstrual period?"       No  Protocols used: Dizziness - Lightheadedness-A-AH

## 2023-09-02 ENCOUNTER — Ambulatory Visit: Payer: Federal, State, Local not specified - PPO | Admitting: Family Medicine

## 2023-09-02 VITALS — BP 128/70 | HR 74 | Temp 98.5°F | Ht 67.0 in | Wt 231.8 lb

## 2023-09-02 DIAGNOSIS — R42 Dizziness and giddiness: Secondary | ICD-10-CM | POA: Diagnosis not present

## 2023-09-02 MED ORDER — MECLIZINE HCL 25 MG PO TABS: 25.0000 mg | ORAL_TABLET | Freq: Three times a day (TID) | ORAL | 0 refills | Status: AC | PRN

## 2023-09-02 NOTE — Progress Notes (Signed)
Subjective:    Patient ID: Bethany Hernandez, female    DOB: [birth year], 57 y.o.   MRN: [id number]  Dizziness  Patient states she awoke Saturday and went to go to the bathroom.  When she stood up she became extremely swimmy headed.  She felt off balance.  She fell.  Her husband had to help her up.  She denies the room spinning but she definitely felt unsteady on her feet.  This hit her suddenly.  The symptoms past after she went to the bathroom.  She laid back down.  She then stood up the following morning and again felt extremely swimmy headed and lost her balance.  Here today, she denies any true vertigo.  She denies any lightheadedness.  She denies any palpitations or shortness of breath.  She denies any chest pain.  Neurologic exam is normal.  Cranial nerves II through XII are grossly intact with muscle strength 5/5 equal and symmetric in the upper and lower extremities.  While her exam is normal.  Heel-to-toe exam is normal.  Finger-to-nose testing is normal.  There are no carotid bruits.  However I reproduced her dizziness with Dix-Hallpike maneuver. Past Medical History:  Diagnosis Date   Anxiety    Depression    Hemorrhoids    Past Surgical History:  Procedure Laterality Date   BLADDER SURGERY     SPINAL FUSION     Current Outpatient Medications on File Prior to Visit  Medication Sig Dispense Refill   buPROPion (WELLBUTRIN XL) 300 MG 24 hr tablet Take 150 mg by mouth 2 (two) times daily. Takes 1/2 300 mg tablet BID     fluvoxaMINE (LUVOX) 100 MG tablet Take 100 mg by mouth at bedtime.     No current facility-administered medications on file prior to visit.   Marland Kitchenall Social History   Socioeconomic History   Marital status: Married    Spouse name: Not on file   Number of children: 2   Years of education: Not on file   Highest education level: Not on file  Occupational History   Occupation: Data processing manager: [employer]  Tobacco Use   Smoking status: Never   Smokeless tobacco:  Never  Substance and Sexual Activity   Alcohol use: No   Drug use: No   Sexual activity: Yes  Other Topics Concern   Not on file  Social History Narrative   Not on file   Social Determinants of Health   Financial Resource Strain: Not on file  Food Insecurity: Not on file  Transportation Needs: Not on file  Physical Activity: Not on file  Stress: Not on file  Social Connections: Unknown (05/01/2022)   Received from [facility], [facility]   Social Network    Social Network: Not on file  Intimate Partner Violence: Unknown (03/23/2022)   Received from [facility], [facility]   HITS    Physically Hurt: Not on file    Insult or Talk Down To: Not on file    Threaten Physical Harm: Not on file    Scream or Curse: Not on file      Review of Systems  Neurological:  Positive for dizziness.  All other systems reviewed and are negative.      Objective:   Physical Exam Constitutional:      Appearance: Normal appearance. She is normal weight.  HENT:     Head: Normocephalic and atraumatic.     Right Ear: Tympanic membrane and ear canal  normal.     Left Ear: Tympanic membrane and ear canal normal.     Mouth/Throat:     Mouth: Mucous membranes are moist.     Pharynx: Oropharynx is clear. No oropharyngeal exudate or posterior oropharyngeal erythema.  Cardiovascular:     Rate and Rhythm: Normal rate and regular rhythm.     Pulses: Normal pulses.     Heart sounds: Normal heart sounds. No murmur heard.    No friction rub. No gallop.  Pulmonary:     Effort: Pulmonary effort is normal. No respiratory distress.     Breath sounds: Normal breath sounds. No stridor. No wheezing or rhonchi.  Musculoskeletal:     Cervical back: Neck supple.  Neurological:     General: No focal deficit present.     Mental Status: She is alert and oriented to person, place, and time. Mental status is at baseline.     Cranial Nerves: No cranial nerve deficit.     Sensory: No sensory deficit.      Motor: No weakness.     Coordination: Coordination normal.     Gait: Gait normal.     Deep Tendon Reflexes: Reflexes normal.           Assessment & Plan:  Dysequilibrium  Physical exam today is normal.  The patient is suffering from vertigo.  I believe that she is having vertigo upon standing causing her to fall.  Other differential would be orthostatic hypotension but she does not demonstrate this today at all.  I will try the patient on meclizine 25 mg every 8 hours as needed for dizziness and reassess over the next 2 to 3 days

## 2023-10-22 DIAGNOSIS — N943 Premenstrual tension syndrome: Secondary | ICD-10-CM | POA: Diagnosis not present

## 2023-10-22 DIAGNOSIS — F422 Mixed obsessional thoughts and acts: Secondary | ICD-10-CM | POA: Diagnosis not present

## 2023-10-22 DIAGNOSIS — F411 Generalized anxiety disorder: Secondary | ICD-10-CM | POA: Diagnosis not present

## 2023-10-28 DIAGNOSIS — H04123 Dry eye syndrome of bilateral lacrimal glands: Secondary | ICD-10-CM | POA: Diagnosis not present

## 2023-10-28 DIAGNOSIS — H524 Presbyopia: Secondary | ICD-10-CM | POA: Diagnosis not present

## 2023-12-05 DIAGNOSIS — F411 Generalized anxiety disorder: Secondary | ICD-10-CM | POA: Diagnosis not present

## 2023-12-05 DIAGNOSIS — N943 Premenstrual tension syndrome: Secondary | ICD-10-CM | POA: Diagnosis not present

## 2023-12-05 DIAGNOSIS — F422 Mixed obsessional thoughts and acts: Secondary | ICD-10-CM | POA: Diagnosis not present

## 2024-02-03 DIAGNOSIS — D2362 Other benign neoplasm of skin of left upper limb, including shoulder: Secondary | ICD-10-CM | POA: Diagnosis not present

## 2024-02-03 DIAGNOSIS — L821 Other seborrheic keratosis: Secondary | ICD-10-CM | POA: Diagnosis not present

## 2024-02-03 DIAGNOSIS — L814 Other melanin hyperpigmentation: Secondary | ICD-10-CM | POA: Diagnosis not present

## 2024-02-03 DIAGNOSIS — L720 Epidermal cyst: Secondary | ICD-10-CM | POA: Diagnosis not present

## 2024-03-12 DIAGNOSIS — N943 Premenstrual tension syndrome: Secondary | ICD-10-CM | POA: Diagnosis not present

## 2024-03-12 DIAGNOSIS — F422 Mixed obsessional thoughts and acts: Secondary | ICD-10-CM | POA: Diagnosis not present

## 2024-03-12 DIAGNOSIS — F411 Generalized anxiety disorder: Secondary | ICD-10-CM | POA: Diagnosis not present

## 2024-03-18 DIAGNOSIS — Z6837 Body mass index (BMI) 37.0-37.9, adult: Secondary | ICD-10-CM | POA: Diagnosis not present

## 2024-03-18 DIAGNOSIS — Z124 Encounter for screening for malignant neoplasm of cervix: Secondary | ICD-10-CM | POA: Diagnosis not present

## 2024-03-18 DIAGNOSIS — Z1231 Encounter for screening mammogram for malignant neoplasm of breast: Secondary | ICD-10-CM | POA: Diagnosis not present

## 2024-03-18 DIAGNOSIS — Z01419 Encounter for gynecological examination (general) (routine) without abnormal findings: Secondary | ICD-10-CM | POA: Diagnosis not present

## 2024-05-20 DIAGNOSIS — F411 Generalized anxiety disorder: Secondary | ICD-10-CM | POA: Diagnosis not present

## 2024-05-20 DIAGNOSIS — F422 Mixed obsessional thoughts and acts: Secondary | ICD-10-CM | POA: Diagnosis not present

## 2024-07-01 ENCOUNTER — Emergency Department (HOSPITAL_COMMUNITY)
Admission: EM | Admit: 2024-07-01 | Discharge: 2024-07-02 | Discharge status: Against Medical Advice | Attending: Emergency Medicine | Admitting: Emergency Medicine

## 2024-07-01 ENCOUNTER — Encounter (HOSPITAL_COMMUNITY): Payer: Self-pay

## 2024-07-01 ENCOUNTER — Other Ambulatory Visit: Payer: Self-pay

## 2024-07-01 DIAGNOSIS — F32A Depression, unspecified: Secondary | ICD-10-CM | POA: Diagnosis not present

## 2024-07-01 DIAGNOSIS — K8012 Calculus of gallbladder with acute and chronic cholecystitis without obstruction: Secondary | ICD-10-CM | POA: Diagnosis not present

## 2024-07-01 DIAGNOSIS — R1011 Right upper quadrant pain: Secondary | ICD-10-CM | POA: Insufficient documentation

## 2024-07-01 DIAGNOSIS — F419 Anxiety disorder, unspecified: Secondary | ICD-10-CM | POA: Diagnosis not present

## 2024-07-01 DIAGNOSIS — I10 Essential (primary) hypertension: Secondary | ICD-10-CM | POA: Diagnosis not present

## 2024-07-01 DIAGNOSIS — K819 Cholecystitis, unspecified: Secondary | ICD-10-CM | POA: Diagnosis not present

## 2024-07-01 DIAGNOSIS — R110 Nausea: Secondary | ICD-10-CM | POA: Insufficient documentation

## 2024-07-01 DIAGNOSIS — Z5321 Procedure and treatment not carried out due to patient leaving prior to being seen by health care provider: Secondary | ICD-10-CM | POA: Insufficient documentation

## 2024-07-01 DIAGNOSIS — K810 Acute cholecystitis: Secondary | ICD-10-CM | POA: Diagnosis not present

## 2024-07-01 LAB — URINALYSIS, ROUTINE W REFLEX MICROSCOPIC
Bacteria, UA: NONE SEEN
Bilirubin Urine: NEGATIVE
Glucose, UA: NEGATIVE mg/dL
Hgb urine dipstick: NEGATIVE
Ketones, ur: NEGATIVE mg/dL
Nitrite: NEGATIVE
Protein, ur: NEGATIVE mg/dL
Specific Gravity, Urine: 1.023 (ref 1.005–1.030)
pH: 5 (ref 5.0–8.0)

## 2024-07-01 LAB — CBC
HCT: 44.7 % (ref 36.0–46.0)
Hemoglobin: 14.6 g/dL (ref 12.0–15.0)
MCH: 29.6 pg (ref 26.0–34.0)
MCHC: 32.7 g/dL (ref 30.0–36.0)
MCV: 90.5 fL (ref 80.0–100.0)
Platelets: 246 K/uL (ref 150–400)
RBC: 4.94 MIL/uL (ref 3.87–5.11)
RDW: 13.1 % (ref 11.5–15.5)
WBC: 11.8 K/uL — ABNORMAL HIGH (ref 4.0–10.5)
nRBC: 0 % (ref 0.0–0.2)

## 2024-07-01 NOTE — ED Triage Notes (Signed)
 Pt arrived from home via Pov c/o RUQ pain since 4pm today 4/10 described as cramping. Pt states that she is very nauseated, denies emesis and diarrhea

## 2024-07-02 ENCOUNTER — Other Ambulatory Visit: Payer: Self-pay

## 2024-07-02 ENCOUNTER — Emergency Department (HOSPITAL_BASED_OUTPATIENT_CLINIC_OR_DEPARTMENT_OTHER)

## 2024-07-02 ENCOUNTER — Ambulatory Visit: Admitting: Family Medicine

## 2024-07-02 ENCOUNTER — Emergency Department (HOSPITAL_COMMUNITY): Admitting: Anesthesiology

## 2024-07-02 ENCOUNTER — Encounter (HOSPITAL_COMMUNITY)
Admission: EM | Disposition: A | Discharge status: Home / Self Care | Payer: Self-pay | Source: Home / Self Care | Attending: Emergency Medicine

## 2024-07-02 ENCOUNTER — Ambulatory Visit (HOSPITAL_BASED_OUTPATIENT_CLINIC_OR_DEPARTMENT_OTHER)
Admission: EM | Admit: 2024-07-02 | Discharge: 2024-07-02 | Disposition: A | Discharge status: Home / Self Care | Attending: Emergency Medicine | Admitting: Emergency Medicine

## 2024-07-02 ENCOUNTER — Encounter (HOSPITAL_BASED_OUTPATIENT_CLINIC_OR_DEPARTMENT_OTHER): Payer: Self-pay | Admitting: Emergency Medicine

## 2024-07-02 ENCOUNTER — Other Ambulatory Visit (HOSPITAL_COMMUNITY): Payer: Self-pay

## 2024-07-02 ENCOUNTER — Emergency Department (HOSPITAL_COMMUNITY)

## 2024-07-02 DIAGNOSIS — K810 Acute cholecystitis: Secondary | ICD-10-CM | POA: Diagnosis not present

## 2024-07-02 DIAGNOSIS — K819 Cholecystitis, unspecified: Secondary | ICD-10-CM | POA: Diagnosis not present

## 2024-07-02 DIAGNOSIS — K8012 Calculus of gallbladder with acute and chronic cholecystitis without obstruction: Secondary | ICD-10-CM | POA: Insufficient documentation

## 2024-07-02 DIAGNOSIS — R109 Unspecified abdominal pain: Secondary | ICD-10-CM | POA: Diagnosis not present

## 2024-07-02 DIAGNOSIS — R1084 Generalized abdominal pain: Secondary | ICD-10-CM | POA: Diagnosis not present

## 2024-07-02 DIAGNOSIS — K8010 Calculus of gallbladder with chronic cholecystitis without obstruction: Secondary | ICD-10-CM | POA: Diagnosis not present

## 2024-07-02 DIAGNOSIS — N858 Other specified noninflammatory disorders of uterus: Secondary | ICD-10-CM

## 2024-07-02 DIAGNOSIS — F32A Depression, unspecified: Secondary | ICD-10-CM | POA: Insufficient documentation

## 2024-07-02 DIAGNOSIS — F419 Anxiety disorder, unspecified: Secondary | ICD-10-CM | POA: Insufficient documentation

## 2024-07-02 DIAGNOSIS — Z743 Need for continuous supervision: Secondary | ICD-10-CM | POA: Diagnosis not present

## 2024-07-02 DIAGNOSIS — I10 Essential (primary) hypertension: Secondary | ICD-10-CM | POA: Diagnosis not present

## 2024-07-02 DIAGNOSIS — K8000 Calculus of gallbladder with acute cholecystitis without obstruction: Secondary | ICD-10-CM | POA: Diagnosis not present

## 2024-07-02 HISTORY — PX: CHOLECYSTECTOMY: SHX55

## 2024-07-02 LAB — URINALYSIS, ROUTINE W REFLEX MICROSCOPIC
Bilirubin Urine: NEGATIVE
Glucose, UA: NEGATIVE mg/dL
Hgb urine dipstick: NEGATIVE
Ketones, ur: NEGATIVE mg/dL
Leukocytes,Ua: NEGATIVE
Nitrite: NEGATIVE
Protein, ur: NEGATIVE mg/dL
Specific Gravity, Urine: 1.01 (ref 1.005–1.030)
pH: 6.5 (ref 5.0–8.0)

## 2024-07-02 LAB — CBC
HCT: 44.2 % (ref 36.0–46.0)
Hemoglobin: 14.7 g/dL (ref 12.0–15.0)
MCH: 30 pg (ref 26.0–34.0)
MCHC: 33.3 g/dL (ref 30.0–36.0)
MCV: 90.2 fL (ref 80.0–100.0)
Platelets: 244 K/uL (ref 150–400)
RBC: 4.9 MIL/uL (ref 3.87–5.11)
RDW: 13.1 % (ref 11.5–15.5)
WBC: 12.7 K/uL — ABNORMAL HIGH (ref 4.0–10.5)
nRBC: 0 % (ref 0.0–0.2)

## 2024-07-02 LAB — COMPREHENSIVE METABOLIC PANEL WITH GFR
ALT: 21 U/L (ref 0–44)
ALT: 17 U/L (ref 0–44)
AST: 21 U/L (ref 15–41)
AST: 23 U/L (ref 15–41)
Albumin: 3.8 g/dL (ref 3.5–5.0)
Albumin: 4.4 g/dL (ref 3.5–5.0)
Alkaline Phosphatase: 34 U/L — ABNORMAL LOW (ref 38–126)
Alkaline Phosphatase: 44 U/L (ref 38–126)
Anion gap: 11 (ref 5–15)
Anion gap: 13 (ref 5–15)
BUN: 17 mg/dL (ref 6–20)
BUN: 14 mg/dL (ref 6–20)
CO2: 25 mmol/L (ref 22–32)
CO2: 24 mmol/L (ref 22–32)
Calcium: 9.6 mg/dL (ref 8.9–10.3)
Calcium: 9.6 mg/dL (ref 8.9–10.3)
Chloride: 105 mmol/L (ref 98–111)
Chloride: 104 mmol/L (ref 98–111)
Creatinine, Ser: 1.19 mg/dL — ABNORMAL HIGH (ref 0.44–1.00)
Creatinine, Ser: 1.01 mg/dL — ABNORMAL HIGH (ref 0.44–1.00)
GFR, Estimated: 54 mL/min — ABNORMAL LOW (ref >60)
GFR, Estimated: >60 mL/min (ref >60)
Glucose, Bld: 135 mg/dL — ABNORMAL HIGH (ref 70–99)
Glucose, Bld: 106 mg/dL — ABNORMAL HIGH (ref 70–99)
Potassium: 4.4 mmol/L (ref 3.5–5.1)
Potassium: 4.3 mmol/L (ref 3.5–5.1)
Sodium: 141 mmol/L (ref 135–145)
Sodium: 141 mmol/L (ref 135–145)
Total Bilirubin: 0.3 mg/dL (ref 0.0–1.2)
Total Bilirubin: 0.3 mg/dL (ref 0.0–1.2)
Total Protein: 7.2 g/dL (ref 6.5–8.1)
Total Protein: 7.7 g/dL (ref 6.5–8.1)

## 2024-07-02 LAB — LIPASE, BLOOD
Lipase: 27 U/L (ref 11–51)
Lipase: 14 U/L (ref 11–51)

## 2024-07-02 SURGERY — LAPAROSCOPIC CHOLECYSTECTOMY WITH INTRAOPERATIVE CHOLANGIOGRAM
Anesthesia: General

## 2024-07-02 MED ORDER — SODIUM CHLORIDE 0.9 % IR SOLN
Status: DC | PRN
  Administered 2024-07-02: 1000 mL

## 2024-07-02 MED ORDER — MORPHINE SULFATE (PF) 4 MG/ML IV SOLN
4.0000 mg | Freq: Once | INTRAVENOUS | Status: AC
  Administered 2024-07-02: 4 mg via INTRAVENOUS
  Filled 2024-07-02: qty 1

## 2024-07-02 MED ORDER — FENTANYL CITRATE (PF) 250 MCG/5ML IJ SOLN
INTRAMUSCULAR | Status: DC | PRN
  Administered 2024-07-02: 50 ug via INTRAVENOUS
  Administered 2024-07-02: 100 ug via INTRAVENOUS

## 2024-07-02 MED ORDER — PHENYLEPHRINE 80 MCG/ML (10ML) SYRINGE FOR IV PUSH (FOR BLOOD PRESSURE SUPPORT)
PREFILLED_SYRINGE | INTRAVENOUS | Status: DC | PRN
  Administered 2024-07-02: 80 ug via INTRAVENOUS
  Administered 2024-07-02: 120 ug via INTRAVENOUS

## 2024-07-02 MED ORDER — SODIUM CHLORIDE 0.9 % IV BOLUS
1000.0000 mL | Freq: Once | INTRAVENOUS | Status: AC
  Administered 2024-07-02: 1000 mL via INTRAVENOUS

## 2024-07-02 MED ORDER — LIDOCAINE 2% (20 MG/ML) 5 ML SYRINGE
INTRAMUSCULAR | Status: AC
  Filled 2024-07-02: qty 5

## 2024-07-02 MED ORDER — ORAL CARE MOUTH RINSE: 15.0000 mL | Freq: Once | OROMUCOSAL | Status: AC

## 2024-07-02 MED ORDER — DEXAMETHASONE SODIUM PHOSPHATE 10 MG/ML IJ SOLN
INTRAMUSCULAR | Status: AC
  Filled 2024-07-02: qty 1

## 2024-07-02 MED ORDER — SODIUM CHLORIDE 0.9 % IV SOLN
2.0000 g | Freq: Once | INTRAVENOUS | Status: AC
  Administered 2024-07-02: 2 g via INTRAVENOUS
  Filled 2024-07-02: qty 20

## 2024-07-02 MED ORDER — SUGAMMADEX SODIUM 200 MG/2ML IV SOLN
INTRAVENOUS | Status: DC | PRN
  Administered 2024-07-02: 200 mg via INTRAVENOUS

## 2024-07-02 MED ORDER — METRONIDAZOLE 500 MG/100ML IV SOLN
INTRAVENOUS | Status: AC
  Filled 2024-07-02: qty 100

## 2024-07-02 MED ORDER — FENTANYL CITRATE (PF) 250 MCG/5ML IJ SOLN
INTRAMUSCULAR | Status: AC
  Filled 2024-07-02: qty 5

## 2024-07-02 MED ORDER — BUPIVACAINE-EPINEPHRINE (PF) 0.25% -1:200000 IJ SOLN
INTRAMUSCULAR | Status: AC
  Filled 2024-07-02: qty 30

## 2024-07-02 MED ORDER — OXYCODONE HCL 5 MG PO TABS
5.0000 mg | ORAL_TABLET | Freq: Four times a day (QID) | ORAL | 0 refills | Status: AC | PRN
  Filled 2024-07-02: qty 15, 4d supply, fill #0

## 2024-07-02 MED ORDER — ROCURONIUM BROMIDE 10 MG/ML (PF) SYRINGE
PREFILLED_SYRINGE | INTRAVENOUS | Status: AC
  Filled 2024-07-02: qty 10

## 2024-07-02 MED ORDER — ACETAMINOPHEN 500 MG PO TABS
1000.0000 mg | ORAL_TABLET | Freq: Once | ORAL | Status: AC
  Administered 2024-07-02: 1000 mg via ORAL
  Filled 2024-07-02: qty 2

## 2024-07-02 MED ORDER — LIDOCAINE 2% (20 MG/ML) 5 ML SYRINGE
INTRAMUSCULAR | Status: DC | PRN
  Administered 2024-07-02: 60 mg via INTRAVENOUS

## 2024-07-02 MED ORDER — 0.9 % SODIUM CHLORIDE (POUR BTL) OPTIME
TOPICAL | Status: DC | PRN
  Administered 2024-07-02: 1000 mL

## 2024-07-02 MED ORDER — BUPIVACAINE-EPINEPHRINE 0.25% -1:200000 IJ SOLN
INTRAMUSCULAR | Status: DC | PRN
  Administered 2024-07-02: 15 mL

## 2024-07-02 MED ORDER — LACTATED RINGERS IV SOLN
INTRAVENOUS | Status: DC
Start: 2024-07-02 — End: 2024-07-02

## 2024-07-02 MED ORDER — MIDAZOLAM HCL 2 MG/2ML IJ SOLN
INTRAMUSCULAR | Status: AC
  Filled 2024-07-02: qty 2

## 2024-07-02 MED ORDER — IOHEXOL 300 MG/ML  SOLN
100.0000 mL | Freq: Once | INTRAMUSCULAR | Status: AC | PRN
  Administered 2024-07-02: 100 mL via INTRAVENOUS

## 2024-07-02 MED ORDER — SODIUM CHLORIDE 0.9 % IV SOLN: INTRAVENOUS | Status: DC | PRN

## 2024-07-02 MED ORDER — ROCURONIUM BROMIDE 10 MG/ML (PF) SYRINGE
PREFILLED_SYRINGE | INTRAVENOUS | Status: DC | PRN
  Administered 2024-07-02: 50 mg via INTRAVENOUS

## 2024-07-02 MED ORDER — ONDANSETRON HCL 4 MG/2ML IJ SOLN
4.0000 mg | Freq: Once | INTRAMUSCULAR | Status: AC
  Administered 2024-07-02: 4 mg via INTRAVENOUS
  Filled 2024-07-02: qty 2

## 2024-07-02 MED ORDER — SUGAMMADEX SODIUM 200 MG/2ML IV SOLN
INTRAVENOUS | Status: AC
  Filled 2024-07-02: qty 2

## 2024-07-02 MED ORDER — PROPOFOL 10 MG/ML IV BOLUS
INTRAVENOUS | Status: DC | PRN
  Administered 2024-07-02: 130 mg via INTRAVENOUS

## 2024-07-02 MED ORDER — ONDANSETRON HCL 4 MG/2ML IJ SOLN
INTRAMUSCULAR | Status: DC | PRN
  Administered 2024-07-02: 4 mg via INTRAVENOUS

## 2024-07-02 MED ORDER — CHLORHEXIDINE GLUCONATE 0.12 % MT SOLN
15.0000 mL | Freq: Once | OROMUCOSAL | Status: AC
Start: 2024-07-02 — End: 2024-07-02
  Administered 2024-07-02: 15 mL via OROMUCOSAL

## 2024-07-02 MED ORDER — DEXAMETHASONE SODIUM PHOSPHATE 10 MG/ML IJ SOLN
INTRAMUSCULAR | Status: DC | PRN
  Administered 2024-07-02: 10 mg via INTRAVENOUS

## 2024-07-02 MED ORDER — ONDANSETRON HCL 4 MG/2ML IJ SOLN
INTRAMUSCULAR | Status: AC
  Filled 2024-07-02: qty 2

## 2024-07-02 MED ORDER — PROPOFOL 10 MG/ML IV BOLUS
INTRAVENOUS | Status: AC
  Filled 2024-07-02: qty 20

## 2024-07-02 SURGICAL SUPPLY — 36 items
BLADE CLIPPER SURG (BLADE) IMPLANT
CANISTER SUCTION 3000ML PPV (SUCTIONS) ×1 IMPLANT
CHLORAPREP W/TINT 26 (MISCELLANEOUS) ×1 IMPLANT
CLIP APPLIE 5 13 M/L LIGAMAX5 (MISCELLANEOUS) ×1 IMPLANT
COVER MAYO STAND STRL (DRAPES) ×1 IMPLANT
COVER SURGICAL LIGHT HANDLE (MISCELLANEOUS) ×1 IMPLANT
DERMABOND ADVANCED .7 DNX12 (GAUZE/BANDAGES/DRESSINGS) ×1 IMPLANT
DRAPE C-ARM 42X120 X-RAY (DRAPES) ×1 IMPLANT
ELECTRODE REM PT RTRN 9FT ADLT (ELECTROSURGICAL) ×1 IMPLANT
GLOVE BIO SURGEON STRL SZ8 (GLOVE) ×1 IMPLANT
GLOVE BIOGEL PI IND STRL 8 (GLOVE) ×1 IMPLANT
GOWN STRL REUS W/ TWL LRG LVL3 (GOWN DISPOSABLE) ×2 IMPLANT
GOWN STRL REUS W/ TWL XL LVL3 (GOWN DISPOSABLE) ×1 IMPLANT
IRRIGATION SUCT STRKRFLW 2 WTP (MISCELLANEOUS) ×1 IMPLANT
KIT BASIN OR (CUSTOM PROCEDURE TRAY) ×1 IMPLANT
KIT TURNOVER KIT B (KITS) ×1 IMPLANT
LHOOK LAP DISP 36CM (ELECTROSURGICAL) ×1 IMPLANT
NDL 22X1.5 STRL (OR ONLY) (MISCELLANEOUS) ×1 IMPLANT
NEEDLE 22X1.5 STRL (OR ONLY) (MISCELLANEOUS) ×1 IMPLANT
NS IRRIG 1000ML POUR BTL (IV SOLUTION) ×1 IMPLANT
PAD ARMBOARD POSITIONER FOAM (MISCELLANEOUS) ×1 IMPLANT
PENCIL BUTTON HOLSTER BLD 10FT (ELECTRODE) ×1 IMPLANT
POUCH RETRIEVAL ECOSAC 10 (ENDOMECHANICALS) ×1 IMPLANT
SCISSORS LAP 5X35 DISP (ENDOMECHANICALS) ×1 IMPLANT
SET CHOLANGIOGRAPH 5 50 .035 (SET/KITS/TRAYS/PACK) ×1 IMPLANT
SET TUBE SMOKE EVAC HIGH FLOW (TUBING) ×1 IMPLANT
SLEEVE Z-THREAD 5X100MM (TROCAR) ×2 IMPLANT
SPECIMEN JAR SMALL (MISCELLANEOUS) ×1 IMPLANT
SUT VIC AB 4-0 PS2 27 (SUTURE) ×1 IMPLANT
TOWEL GREEN STERILE (TOWEL DISPOSABLE) ×1 IMPLANT
TOWEL GREEN STERILE FF (TOWEL DISPOSABLE) ×1 IMPLANT
TRAY LAPAROSCOPIC MC (CUSTOM PROCEDURE TRAY) ×1 IMPLANT
TROCAR BALLN 12MMX100 BLUNT (TROCAR) ×1 IMPLANT
TROCAR Z-THREAD OPTICAL 5X100M (TROCAR) ×1 IMPLANT
WARMER LAPAROSCOPE (MISCELLANEOUS) ×1 IMPLANT
WATER STERILE IRR 1000ML POUR (IV SOLUTION) ×1 IMPLANT

## 2024-07-02 NOTE — Transfer of Care (Signed)
 Immediate Anesthesia Transfer of Care Note  Patient: Ariann Khaimov [facility]  Procedure(s) Performed: LAPAROSCOPIC CHOLECYSTECTOMY  Patient Location: PACU  Anesthesia Type:General  Level of Consciousness: awake and alert   Airway & Oxygen Therapy: Patient Spontanous Breathing and Patient connected to face mask oxygen  Post-op Assessment: Report given to RN and Post -op Vital signs reviewed and stable  Post vital signs: Reviewed and stable  Last Vitals:  Vitals Value Taken Time  BP 118/66 07/02/24 17:22  Temp    Pulse 86 07/02/24 17:24  Resp 14 07/02/24 17:24  SpO2 97 % 07/02/24 17:24  Vitals shown include unfiled device data.  Last Pain:  Vitals:   07/02/24 1721  TempSrc:   PainSc: 0-No pain         Complications: No notable events documented.

## 2024-07-02 NOTE — ED Notes (Signed)
 Pt unable to provide urine sample at this time, pt was given urine cup for sample when able to provide sample

## 2024-07-02 NOTE — ED Provider Notes (Signed)
 [facility] DE[facility]MENT AT [facility] Provider Note   CSN: [id number] Arrival date & time: 07/02/24  9077     Patient presents with: Abdominal Pain   Bethany Hernandez is a 58 y.o. female.    Abdominal Pain   58 year old female presents emergency department with complaints of right upper quadrant abdominal pain and nausea.  Patient reports pain constant since yesterday afternoon.  States that she has felt nauseated and has not been able to eat very much but has been able to tolerate liquids.  Denies any fevers, chills, emesis, urinary symptoms, change in bowel habits.  Does state there is a history of stone in her gallbladder and was post to have appointment with general surgeon in 2017 but became fearful of surgery and never followed up.    Past medical history significant for anxiety, depression, gallbladder calculus, hemorrhoid  Prior to Admission medications   Medication Sig Start Date End Date Taking? Authorizing Provider  buPROPion (WELLBUTRIN XL) 300 MG 24 hr tablet Take 150 mg by mouth 2 (two) times daily. Takes 1/2 300 mg tablet BID 05/20/16   [provider]  fluvoxaMINE (LUVOX) 100 MG tablet Take 100 mg by mouth at bedtime.    [provider]  meclizine  (ANTIVERT ) 25 MG tablet Take 1 tablet (25 mg total) by mouth 3 (three) times daily as needed for dizziness. 09/02/23   Duanne Butler DASEN, MD    Allergies: Sulfa antibiotics and Sulfonamide derivatives    Review of Systems  Gastrointestinal:  Positive for abdominal pain.  All other systems reviewed and are negative.   Updated Vital Signs BP (!) 146/82 (BP Location: Right Arm)   Pulse 71   Temp 98.2 F (36.8 C) (Oral)   Resp 16   Ht 5' 6 (1.676 m)   Wt 104.3 kg   LMP 07/10/2018 (Approximate)   SpO2 97%   BMI 37.12 kg/m   Physical Exam Vitals and nursing note reviewed.  Constitutional:      General: She is not in acute distress.    Appearance: She is well-developed.  HENT:      Head: Normocephalic and atraumatic.  Eyes:     Conjunctiva/sclera: Conjunctivae normal.  Cardiovascular:     Rate and Rhythm: Normal rate and regular rhythm.     Heart sounds: No murmur heard. Pulmonary:     Effort: Pulmonary effort is normal. No respiratory distress.     Breath sounds: Normal breath sounds.  Abdominal:     Palpations: Abdomen is soft.     Tenderness: There is abdominal tenderness in the right upper quadrant and epigastric area.  Musculoskeletal:        General: No swelling.     Cervical back: Neck supple.  Skin:    General: Skin is warm and dry.     Capillary Refill: Capillary refill takes less than 2 seconds.  Neurological:     Mental Status: She is alert.  Psychiatric:        Mood and Affect: Mood normal.     (all labs ordered are listed, but only abnormal results are displayed) Labs Reviewed  LIPASE, BLOOD  COMPREHENSIVE METABOLIC PANEL WITH GFR  CBC  URINALYSIS, ROUTINE W REFLEX MICROSCOPIC    EKG: None  Radiology: No results found.   Procedures   Medications Ordered in the ED - No data to display  Medical Decision Making Amount and/or Complexity of Data Reviewed Labs: ordered. Radiology: ordered.  Risk Prescription drug management. Decision regarding hospitalization.   This patient presents to the ED for concern of abdominal pain, this involves an extensive number of treatment options, and is a complaint that carries with it a high risk of complications and morbidity.  The differential diagnosis includes gastritis, PUD, cholecystitis, CBD pathology, SBO/LBO, volvulus, diverticulitis, appendicitis, nephrolithiasis, pyelonephritis, cystitis, other   Co morbidities that complicate the patient evaluation  See HPI   Additional history obtained:  Additional history obtained from EMR External records from outside source obtained and reviewed including hospital records   Lab Tests:  I Ordered,  and personally interpreted labs.  The pertinent results include: Leukocytosis of 12.7.  No evidence of anemia.  Platelets within range.  No Electra abnormalities.  Patient with slightly elevated creatinine 1.1.  No transaminitis.  UA without abnormality.  Lipase within normal limits.   Imaging Studies ordered:  I ordered imaging studies including CT abdomen pelvis I independently visualized and interpreted imaging which showed acute cholecystitis.  Mild retroperitoneal mesenteric stranding.  Mild hyperattenuating round mass replacing nearly the entire uterus likely leiomyoma.  Bilateral L5 pars intra-articular's defects with grade 1 anterolisthesis at L5-S1 I agree with the radiologist interpretation  Cardiac Monitoring: / EKG:  The patient was maintained on a cardiac monitor.  I personally viewed and interpreted the cardiac monitored which showed an underlying rhythm of: Sinus rhythm   Consultations Obtained:  N/a   Problem List / ED Course / Critical interventions / Medication management  Cholecystitis I ordered medication including Zofran , normal saline, morphine , Rocephin  Reevaluation of the patient after these medicines showed that the patient improved I have reviewed the patients home medicines and have made adjustments as needed   Social Determinants of Health:  Denies tobacco, illicit drug use.   Test / Admission - Considered:  Cholecystitis Vitals signs significant for hypertension blood pressure 146/82. Otherwise within normal range and stable throughout visit. Laboratory/imaging studies significant for: See above 58 year old female presents emergency department with complaints of right upper quadrant abdominal pain and nausea.  Patient reports pain constant since yesterday afternoon.  States that she has felt nauseated and has not been able to eat very much but has been able to tolerate liquids.  Denies any fevers, chills, emesis, urinary symptoms, change in bowel  habits.  Does state there is a history of stone in her gallbladder and was post to have appointment with general surgeon in 2017 but became fearful of surgery and never followed up.   On exam, tenderness of her right upper quadrant abdomen as well as some in epigastric region.  Labs concerning for leukocytosis of 12.7.  CT imaging concerning for cholecystitis.  Consulted general surgery regarding the patient who recommended transfer to [facility] preop for surgical evaluation.  Treatment plan discussed with patient and she acknowledged understanding.  Given IV Rocephin  for empiric antibiotic treatment.  Patient stable upon transfer.      Final diagnoses:  None    ED Discharge Orders     None          Silver Wonda LABOR, [place] 07/02/24 1455    Geraldene Hamilton, MD 07/03/24 [phone]

## 2024-07-02 NOTE — Anesthesia Procedure Notes (Signed)
 Procedure Name: Intubation Date/Time: 07/02/2024 4:15 PM  Performed by: Jama Powell NOVAK, CRNAPre-anesthesia Checklist: Patient identified, Timeout performed, Emergency Drugs available, Suction available and Patient being monitored Patient Re-evaluated:Patient Re-evaluated prior to induction Oxygen Delivery Method: Circle system utilized Preoxygenation: Pre-oxygenation with 100% oxygen Induction Type: IV induction Ventilation: Mask ventilation without difficulty Laryngoscope Size: Mac and 3 Grade View: Grade II Tube type: Oral Tube size: 7.0 mm Number of attempts: 1 Airway Equipment and Method: Stylet Placement Confirmation: ETT inserted through vocal cords under direct vision and positive ETCO2 Secured at: 21 cm Tube secured with: Tape Dental Injury: Teeth and Oropharynx as per pre-operative assessment

## 2024-07-02 NOTE — ED Notes (Signed)
 [facility] otw to transport patient to [facility] Pre-op.  Dr. Sebastian accepting

## 2024-07-02 NOTE — ED Notes (Signed)
 Report given to SS.

## 2024-07-02 NOTE — ED Notes (Signed)
 Pt stated that she was leaving due to her waiting so long

## 2024-07-02 NOTE — Anesthesia Preprocedure Evaluation (Addendum)
 Anesthesia Evaluation  Patient identified by MRN, date of birth, ID band Patient awake    Reviewed: Allergy & Precautions, NPO status , Patient's Chart, lab work & pertinent test results, Unable to perform ROS - Chart review only  History of Anesthesia Complications (+) PROLONGED EMERGENCE and history of anesthetic complications  Airway Mallampati: II  TM Distance: >3 FB Neck ROM: Full    Dental  (+) Dental Advisory Given, Chipped,    Pulmonary neg pulmonary ROS   Pulmonary exam normal breath sounds clear to auscultation       Cardiovascular negative cardio ROS Normal cardiovascular exam Rhythm:Regular Rate:Normal     Neuro/Psych  PSYCHIATRIC DISORDERS Anxiety Depression    negative neurological ROS     GI/Hepatic negative GI ROS, Neg liver ROS,,,  Endo/Other  negative endocrine ROS    Renal/GU negative Renal ROS     Musculoskeletal negative musculoskeletal ROS (+)    Abdominal   Peds  Hematology negative hematology ROS (+)   Anesthesia Other Findings   Reproductive/Obstetrics                              Anesthesia Physical Anesthesia Plan  ASA: 2  Anesthesia Plan: General   Post-op Pain Management: Tylenol  PO (pre-op)*   Induction: Intravenous  PONV Risk Score and Plan: 3 and Midazolam , Dexamethasone  and Ondansetron   Airway Management Planned: Oral ETT  Additional Equipment:   Intra-op Plan:   Post-operative Plan: Extubation in OR  Informed Consent:   Plan Discussed with:   Anesthesia Plan Comments:          Anesthesia Quick Evaluation

## 2024-07-02 NOTE — Discharge Instructions (Signed)

## 2024-07-02 NOTE — Op Note (Signed)
  07/02/2024  5:01 PM  PATIENT:  Bethany Hernandez  58 y.o. female  PRE-OPERATIVE DIAGNOSIS:  CHOLECYSTITIS  POST-OPERATIVE DIAGNOSIS:  CHOLECYSTITIS  PROCEDURE:  Procedure(s): LAPAROSCOPIC CHOLECYSTECTOMY  SURGEON:  Surgeon(s): Sebastian Moles, MD  ASSISTANTS: Powell Seats, RNFA  ANESTHESIA:   local and general  EBL:  Total I/O In: 1110 [I.V.:10; IV Piggyback:1100] Out: -   BLOOD ADMINISTERED:none  DRAINS: none   SPECIMEN:  Excision  DISPOSITION OF SPECIMEN:  PATHOLOGY  COUNTS:  YES  DICTATION: .Dragon Dictation Procedure in detail: Informed consent was obtained.  She received intravenous antibiotics.  She was brought to the operating room and general endotracheal anesthesia was administered by the anesthesia staff.  Her abdomen was prepped and draped in a sterile fashion.  Timeout procedure was performed.The infraumbilical region was infiltrated with local. Infraumbilical incision was made. Subcutaneous tissues were dissected down revealing the anterior fascia. This was divided sharply along the midline. Peritoneal cavity was entered under direct vision without complication. A 0 Vicryl pursestring was placed around the fascial opening. Hassan trocar was inserted into the abdomen. The abdomen was insufflated with carbon dioxide in standard fashion. Under direct vision a 5 mm epigastric and 5 mm right abdominal port x 2 were placed.  Local was used at each port site.  Laparoscopic exploration revealed evidence of acute and chronic cholecystitis with significant inflammation of the gallbladder.  There was a lot of omental adhesions to the dome and body.  These were taken down carefully and the dome was retracted superior and medially.  I further swept away omental adhesions using cautery to get good hemostasis and I revealed the body and infundibulum.  The infundibulum was retracted inferior and laterally.  Dissection began laterally and progressed medially identifying the cystic  duct and cystic artery.  Cystic artery was clipped twice proximally and once distally and divided.  The cystic duct was further dissected until we had a critical view of safety.  Liver function tests were normal and there was a lot of inflammation in the cystic duct so I did not do a cholangiogram.  I then placed 4 clips proximally and 1 distally on the cystic duct and it was divided.  The gallbladder was taken off the liver bed using Bovie cautery.  We did encounter a small posterior branch of the cystic artery which was also clipped.  The gallbladder was taken the rest the way of the liver bed and placed in a bag.  It was removed from the abdomen and sent to pathology.  The liver bed was then cauterized to get excellent hemostasis.  All the clips remained in good position.  The liver bed was dry.  Irrigation fluid was evacuated.  Ports were removed under direct vision.  Pneumoperitoneum was released.  Wounds were irrigated.  The infraumbilical fascia was closed by tying the pursestring.  All 4 wounds were closed with 4-0 Vicryl followed by Dermabond.  All counts were correct.  She tolerated procedure well without apparent complication and was taken recovery in stable condition.  PATIENT DISPOSITION:  PACU - hemodynamically stable.   Delay start of Pharmacological VTE agent (>24hrs) due to surgical blood loss or risk of bleeding:  no  Moles Sebastian, MD, MPH, FACS Pager: [phone]  7/17/20255:01 PM

## 2024-07-02 NOTE — ED Triage Notes (Signed)
 Pt Bethany Hernandez, ambulatory c/o RUQ abd pain with nausea since yesterday afternoon. Pt LWBS at [facility], but did have labs and urine done while in the lobby. Pt states she does still have her gallbladder but has had gallbladder issues in the past.

## 2024-07-02 NOTE — H&P (Signed)
 H&P Note  Bethany Hernandez [facility] 09-08-1966  [id number].    Requesting MD: Wonda Simpers, PA-C Chief Complaint/Reason for Consult: Acute cholecystitis  HPI:  Patient is a 58 year old female with PMH significant for anxiety, depression, hemorrhoids who presented to the ED with RUQ abdominal pain since yesterday afternoon. Pain has been constant. Associated nausea and anorexia but keeping liquids down. Denies fever, chills, diarrhea, constipation, urinary symptoms. She has a known history of cholelithiasis as well and was scheduled for cholecystectomy in 2017 but she became fearful of having surgery and was lost to follow up. Prior abdominal surgery includes bladder surgery as a child but she did not have an abdominal incision. Allergy to sulfa drugs. Not on blood thinners. She denies alcohol, tobacco or illicit drug use. She works for the [employer].  ROS: Negative other than HPI  Family History  Problem Relation Age of Onset   Rectal cancer Mother    COPD Father     Past Medical History:  Diagnosis Date   Anxiety    Depression    Hemorrhoids     Past Surgical History:  Procedure Laterality Date   BLADDER SURGERY     SPINAL FUSION      Social History:  reports that she has never smoked. She has never used smokeless tobacco. She reports that she does not drink alcohol and does not use drugs.  Allergies:  Allergies  Allergen Reactions   Sulfa Antibiotics Hives   Sulfonamide Derivatives Hives and Other (See Comments)    All over the body    (Not in a hospital admission)   Blood pressure (!) 146/77, pulse 69, temperature 98.2 F (36.8 C), temperature source Oral, resp. rate 16, height 5' 6 (1.676 m), weight 104.3 kg, last menstrual period 07/10/2018, SpO2 99%. Physical Exam:  General: pleasant, WD, female who is laying in bed in NAD HEENT: head is normocephalic, atraumatic.  Sclera are anicteric.  Ears and nose without any masses or lesions.  Mouth is pink and  moist Heart: regular, rate, and rhythm.  Normal s1,s2. No obvious murmurs, gallops, or rubs noted.  Palpable radial and pedal pulses bilaterally Lungs: CTAB, no wheezes, rhonchi, or rales noted.  Respiratory effort nonlabored Abd: soft, tender RUQ, no mass, no guarding MS: all 4 extremities are symmetrical with no cyanosis, clubbing, or edema. Skin: warm and dry with no masses, lesions, or rashes Neuro: Cranial nerves 2-12 grossly intact, sensation is normal throughout Psych: A&Ox3 with an appropriate affect.   Results for orders placed or performed during the hospital encounter of 07/02/24 (from the past 48 hours)  Lipase, blood     Status: None   Collection Time: 07/02/24 11:18 AM  Result Value Ref Range   Lipase 14 11 - 51 U/L    Comment: Performed at [facility], [address] [place], [place] [address]  Comprehensive metabolic panel     Status: Abnormal   Collection Time: 07/02/24 11:18 AM  Result Value Ref Range   Sodium 141 135 - 145 mmol/L   Potassium 4.3 3.5 - 5.1 mmol/L    Comment: HEMOLYSIS AT THIS LEVEL MAY AFFECT RESULT   Chloride 104 98 - 111 mmol/L   CO2 24 22 - 32 mmol/L   Glucose, Bld 106 (H) 70 - 99 mg/dL    Comment: Glucose reference range applies only to samples taken after fasting for at least 8 hours.   BUN 14 6 - 20 mg/dL   Creatinine, Ser  1.01 (H) 0.44 - 1.00 mg/dL   Calcium 9.6 8.9 - 89.6 mg/dL   Total Protein 7.7 6.5 - 8.1 g/dL   Albumin 4.4 3.5 - 5.0 g/dL   AST 23 15 - 41 U/L    Comment: HEMOLYSIS AT THIS LEVEL MAY AFFECT RESULT   ALT 17 0 - 44 U/L   Alkaline Phosphatase 44 38 - 126 U/L   Total Bilirubin 0.3 0.0 - 1.2 mg/dL   GFR, Estimated >39 >39 mL/min    Comment: (NOTE) Calculated using the CKD-EPI Creatinine Equation (2021)    Anion gap 13 5 - 15    Comment: Performed at [facility], [address] [place], [place] [address]  CBC     Status: Abnormal   Collection Time: 07/02/24 11:18 AM  Result  Value Ref Range   WBC 12.7 (H) 4.0 - 10.5 K/uL   RBC 4.90 3.87 - 5.11 MIL/uL   Hemoglobin 14.7 12.0 - 15.0 g/dL   HCT 55.7 63.9 - 53.9 %   MCV 90.2 80.0 - 100.0 fL   MCH 30.0 26.0 - 34.0 pg   MCHC 33.3 30.0 - 36.0 g/dL   RDW 86.8 88.4 - 84.4 %   Platelets 244 150 - 400 K/uL   nRBC 0.0 0.0 - 0.2 %    Comment: Performed at [facility], [address], [place], [place] [address]  Urinalysis, Routine w reflex microscopic -Urine, Clean Catch     Status: None   Collection Time: 07/02/24 12:20 PM  Result Value Ref Range   Color, Urine YELLOW YELLOW   APPearance CLEAR CLEAR   Specific Gravity, Urine 1.010 1.005 - 1.030   pH 6.5 5.0 - 8.0   Glucose, UA NEGATIVE NEGATIVE mg/dL   Hgb urine dipstick NEGATIVE NEGATIVE   Bilirubin Urine NEGATIVE NEGATIVE   Ketones, ur NEGATIVE NEGATIVE mg/dL   Protein, ur NEGATIVE NEGATIVE mg/dL   Nitrite NEGATIVE NEGATIVE   Leukocytes,Ua NEGATIVE NEGATIVE    Comment: Microscopic not done on urines with negative protein, blood, leukocytes, nitrite, or glucose < 500 mg/dL. Performed at [facility], [address], [place], [place] [address] CORRECTED ON 07/17 AT 1237: PREVIOUSLY REPORTED AS NEGATIVE    CT ABDOMEN PELVIS W CONTRAST Result Date: 07/02/2024 CLINICAL DATA:  One day history of right upper quadrant abdominal pain and nausea EXAM: CT ABDOMEN AND PELVIS WITH CONTRAST TECHNIQUE: Multidetector CT imaging of the abdomen and pelvis was performed using the standard protocol following bolus administration of intravenous contrast. RADIATION DOSE REDUCTION: This exam was performed according to the departmental dose-optimization program which includes automated exposure control, adjustment of the mA and/or kV according to patient size and/or use of iterative reconstruction technique. CONTRAST:  OMNIPAQUE  IOHEXOL  300 MG/ML  SOLN COMPARISON:  Pain CT abdomen and pelvis dated 03/23/2016 FINDINGS: Lower chest: No focal  consolidation or pulmonary nodule in the lung bases. No pleural effusion or pneumothorax demonstrated. Partially imaged heart size is normal. Hepatobiliary: Scattered subcentimeter hypodensities, too small to characterize, likely cysts. No intra or extrahepatic biliary ductal dilation. Cholelithiasis. Circumferential mild mural thickening and pericholecystic stranding. Pancreas: No focal lesions or main ductal dilation. Spleen: Normal in size without focal abnormality. Adrenals/Urinary Tract: No adrenal nodules. No suspicious renal mass, calculi or hydronephrosis. No focal bladder wall thickening. Stomach/Bowel: Normal appearance of the stomach. No evidence of bowel wall thickening, distention, or inflammatory changes. Normal appendix. Vascular/Lymphatic: No significant vascular findings are present. No enlarged abdominal or pelvic lymph nodes. Reproductive: Mildly hyperattenuating  rounded mass replacing nearly the entire uterus measures 5.4 x 4.7 cm (2:67). Rounded exophytic focus arising from the right uterine body (2:78) may represent the right ovary or a pedunculated leiomyoma. No left adnexal mass. Other: No free fluid, fluid collection, or free air. Mild retroperitoneal mesenteric stranding. Musculoskeletal: No acute or abnormal lytic or blastic osseous lesions. Bilateral L5 pars interarticularis defects with grade 1 anterolisthesis at L5-S1. Multilevel degenerative changes of the partially imaged thoracic and lumbar spine. Lumbar levoscoliosis. IMPRESSION: 1. CT findings of acute cholecystitis. 2. Mild retroperitoneal mesenteric stranding, nonspecific, may reflect sequela of transient acute pancreatitis. 3. Mildly hyperattenuating rounded mass replacing nearly the entire uterus measures 5.4 x 4.7 cm, likely a leiomyoma. Rounded exophytic focus arising from the right uterine body may represent the right ovary or a pedunculated leiomyoma. Recommend further evaluation with nonemergent pelvic ultrasound  examination. 4. Bilateral L5 pars interarticularis defects with grade 1 anterolisthesis at L5-S1. Electronically Signed   By: Limin  Xu M.D.   On: 07/02/2024 12:49      Assessment/Plan Acute cholecystitis  - CT today with cholelithiasis and circumferential mural thickening and pericholecystic stranding - WBC 12.7, afebrile and HD stable - exam and clinical history consistent with above diagnosis  - To OR for laparoscopic cholecystectomy. I discussed the procedure, risks, and benefits. She agrees. Rocephin  IV.  Possible DC from PACU. If needed will admit to observation post-op.  FEN: NPO for OR VTE: None ID: Rocephin  give in ED   I reviewed ED provider notes, last 24 h vitals and pain scores, last 48 h intake and output, last 24 h labs and trends, and last 24 h imaging results.  This care required high  level of medical decision making.   Burnard JONELLE Louder, [facility] 07/02/2024, 1:42 PM Please see Amion for pager number during day hours 7:00am-4:30pm  Dann Hummer, MD, MPH, FACS Please use [email or web address] to contact on call provider

## 2024-07-02 NOTE — ED Notes (Signed)
 Report given to [facility].

## 2024-07-03 ENCOUNTER — Encounter (HOSPITAL_COMMUNITY): Payer: Self-pay | Admitting: General Surgery

## 2024-07-03 NOTE — Anesthesia Postprocedure Evaluation (Signed)
 Anesthesia Post Note  Patient: Bethany Hernandez  Procedure(s) Performed: LAPAROSCOPIC CHOLECYSTECTOMY     Patient location during evaluation: PACU Anesthesia Type: General Level of consciousness: awake and alert Pain management: pain level controlled Vital Signs Assessment: post-procedure vital signs reviewed and stable Respiratory status: spontaneous breathing, nonlabored ventilation, respiratory function stable and patient connected to nasal cannula oxygen Cardiovascular status: blood pressure returned to baseline and stable Postop Assessment: no apparent nausea or vomiting Anesthetic complications: no   No notable events documented.  Last Vitals:  Vitals:   07/02/24 1745 07/02/24 1751  BP: 128/68 127/72  Pulse: 82 78  Resp: 14 15  Temp:  36.7 C  SpO2: 93% 93%    Last Pain:  Vitals:   07/02/24 1745  TempSrc:   PainSc: 0-No pain                 Lacora Folmer L Synda Bagent

## 2024-07-07 LAB — SURGICAL PATHOLOGY

## 2024-08-26 DIAGNOSIS — F422 Mixed obsessional thoughts and acts: Secondary | ICD-10-CM | POA: Diagnosis not present

## 2024-08-26 DIAGNOSIS — F411 Generalized anxiety disorder: Secondary | ICD-10-CM | POA: Diagnosis not present

## 2024-10-19 ENCOUNTER — Ambulatory Visit: Admitting: Family Medicine

## 2024-11-24 DIAGNOSIS — F411 Generalized anxiety disorder: Secondary | ICD-10-CM | POA: Diagnosis not present

## 2024-11-24 DIAGNOSIS — F422 Mixed obsessional thoughts and acts: Secondary | ICD-10-CM | POA: Diagnosis not present

## 2024-12-31 ENCOUNTER — Ambulatory Visit: Admitting: Family Medicine

## 2024-12-31 VITALS — BP 124/82 | HR 75 | Temp 98.2°F | Ht 66.0 in | Wt 230.0 lb

## 2024-12-31 DIAGNOSIS — R599 Enlarged lymph nodes, unspecified: Secondary | ICD-10-CM | POA: Diagnosis not present

## 2024-12-31 MED ORDER — CEPHALEXIN 500 MG PO CAPS: 500.0000 mg | ORAL_CAPSULE | Freq: Three times a day (TID) | ORAL | 0 refills | Status: AC

## 2024-12-31 NOTE — Progress Notes (Signed)
 "  Subjective:    Patient ID: Bethany Hernandez, female    DOB: Apr 05, 1966, 59 y.o.   MRN: [id number]  Patient presents today with pain directly behind the angle of the mandible on her right side as well as her right lower earlobe for 2 days.  She states that yesterday the right lower earlobe was extremely tender to touch however that is better today.  She states that the angle of the mandible is still sore and swollen and she feels like she can feel a small subcutaneous nodule directly behind the angle of the mandible.  On examination today, both tympanic membranes are obscured with wax.  There is no erythema or exudate in the auditory canals.  Patient is exquisitely tender to palpation behind the right angle of the mandible where there is an inflamed lymph node.  However it is small in size and I would estimate 6 mm in diameter.  There is no pain or erythema with palpation of the tragus however the patient states that yesterday that was extremely sore and swollen. Past Medical History:  Diagnosis Date   Anxiety    Depression    Hemorrhoids    Past Surgical History:  Procedure Laterality Date   BLADDER SURGERY     CHOLECYSTECTOMY N/A 07/02/2024   Procedure: LAPAROSCOPIC CHOLECYSTECTOMY;  Surgeon: Sebastian Moles, MD;  Location: [facility] OR;  Service: General;  Laterality: N/A;   SPINAL FUSION     Cervical 3-4   Current Outpatient Medications on File Prior to Visit  Medication Sig Dispense Refill   bismuth subsalicylate (PEPTO BISMOL) 262 MG/15ML suspension Take 15 mLs by mouth as needed.     buPROPion (WELLBUTRIN XL) 300 MG 24 hr tablet Take 300 mg by mouth daily.     fluvoxaMINE (LUVOX) 100 MG tablet Take 300 mg by mouth daily.     meclizine  (ANTIVERT ) 25 MG tablet Take 1 tablet (25 mg total) by mouth 3 (three) times daily as needed for dizziness. (Patient not taking: Reported on 12/31/2024) 30 tablet 0   oxyCODONE  (OXY IR/ROXICODONE ) 5 MG immediate release tablet Take 1 tablet (5 mg total) by mouth  every 6 (six) hours as needed for severe pain (pain score 7-10). (Patient not taking: Reported on 12/31/2024) 15 tablet 0   No current facility-administered medications on file prior to visit.   SABRAall Social History   Socioeconomic History   Marital status: Married    Spouse name: Not on file   Number of children: 2   Years of education: Not on file   Highest education level: Associate degree: academic program  Occupational History   Occupation: Data Processing Manager: [employer]  Tobacco Use   Smoking status: Never   Smokeless tobacco: Never  Substance and Sexual Activity   Alcohol use: No   Drug use: No   Sexual activity: Yes  Other Topics Concern   Not on file  Social History Narrative   Not on file   Social Drivers of Health   Tobacco Use: Low Risk (12/31/2024)   Patient History    Smoking Tobacco Use: Never    Smokeless Tobacco Use: Never    Passive Exposure: Not on file  Financial Resource Strain: Medium Risk (12/31/2024)   Overall Financial Resource Strain (CARDIA)    Difficulty of Paying Living Expenses: Somewhat hard  Food Insecurity: No Food Insecurity (12/31/2024)   Epic    Worried About Radiation Protection Practitioner of Food in the Last Year: Never true  Ran Out of Food in the Last Year: Never true  Transportation Needs: No Transportation Needs (12/31/2024)   Epic    Lack of Transportation (Medical): No    Lack of Transportation (Non-Medical): No  Physical Activity: Inactive (12/31/2024)   Exercise Vital Sign    Days of Exercise per Week: 0 days    Minutes of Exercise per Session: Not on file  Stress: No Stress Concern Present (12/31/2024)   [employer] of Occupational Health - Occupational Stress Questionnaire    Feeling of Stress: Not at all  Social Connections: Moderately Isolated (12/31/2024)   Social Connection and Isolation Panel    Frequency of Communication with Friends and Family: Never    Frequency of Social Gatherings with Friends and Family: Once a week     Attends Religious Services: More than 4 times per year    Active Member of [employer] or Organizations: No    Attends Banker Meetings: Not on file    Marital Status: Married  Intimate Partner Violence: Unknown (03/23/2022)   Received from [facility]   HITS    Physically Hurt: Not on file    Insult or Talk Down To: Not on file    Threaten Physical Harm: Not on file    Scream or Curse: Not on file  Depression (PHQ2-9): Low Risk (09/02/2023)   Depression (PHQ2-9)    PHQ-2 Score: 0  Alcohol Screen: Not on file  Housing: Low Risk (12/31/2024)   Epic    Unable to Pay for Housing in the Last Year: No    Number of Times Moved in the Last Year: 0    Homeless in the Last Year: No  Utilities: Not on file  Health Literacy: Not on file      Review of Systems  Neurological:  Positive for dizziness.  All other systems reviewed and are negative.      Objective:   Physical Exam Constitutional:      Appearance: Normal appearance. She is normal weight.  HENT:     Head: Normocephalic and atraumatic.      Right Ear: Tympanic membrane and ear canal normal.     Left Ear: Tympanic membrane and ear canal normal.     Mouth/Throat:     Mouth: Mucous membranes are moist.     Pharynx: Oropharynx is clear. No oropharyngeal exudate or posterior oropharyngeal erythema.  Cardiovascular:     Rate and Rhythm: Normal rate and regular rhythm.     Pulses: Normal pulses.     Heart sounds: Normal heart sounds. No murmur heard.    No friction rub. No gallop.  Pulmonary:     Effort: Pulmonary effort is normal. No respiratory distress.     Breath sounds: Normal breath sounds. No stridor. No wheezing or rhonchi.  Musculoskeletal:     Cervical back: Neck supple.  Neurological:     General: No focal deficit present.     Mental Status: She is alert and oriented to person, place, and time. Mental status is at baseline.     Cranial Nerves: No cranial nerve deficit.     Sensory: No sensory deficit.      Motor: No weakness.     Coordination: Coordination normal.     Gait: Gait normal.     Deep Tendon Reflexes: Reflexes normal.           Assessment & Plan:  Reactive lymphadenopathy Patient has what appears to be a tender nodule just behind the angle of the  mandible on the right side.  I believe that that is a reactive lymph node likely to a superficial skin infection in the tragus most likely due to an earring.  It seems like it is resolving on its own.  I recommended ibuprofen 800 mg every 8 hours and warm compresses.  Monitor this area over the next 48 hours and anticipate gradual improvement.  If the swelling worsens or the erythema returns on the lower earlobe she can start Keflex  for cellulitis "
